# Patient Record
Sex: Male | Born: 1995
Health system: Western US, Academic
[De-identification: ages and names within clinical notes are randomized; demographics above are authoritative.]

## PROBLEM LIST (undated history)

## (undated) DIAGNOSIS — E079 Disorder of thyroid, unspecified: Secondary | ICD-10-CM

## (undated) HISTORY — PX: TONSILLECTOMY AND ADENOIDECTOMY: SUR1326

## (undated) HISTORY — DX: Disorder of thyroid, unspecified: E07.9

---
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
## 2016-05-10 IMAGING — CR CERVICAL SPINE - 2-3 VIEW
1 series · 4 of 4 positions shown · non-contrast
Comparison: None.

CLINICAL DATA: Pain following fall

EXAM:
CERVICAL SPINE - 2-3 VIEW

[Series 1: dxr c- spine ap and lateral · 0.14mm/px · 4 of 4 slices shown]
[im 1/4]
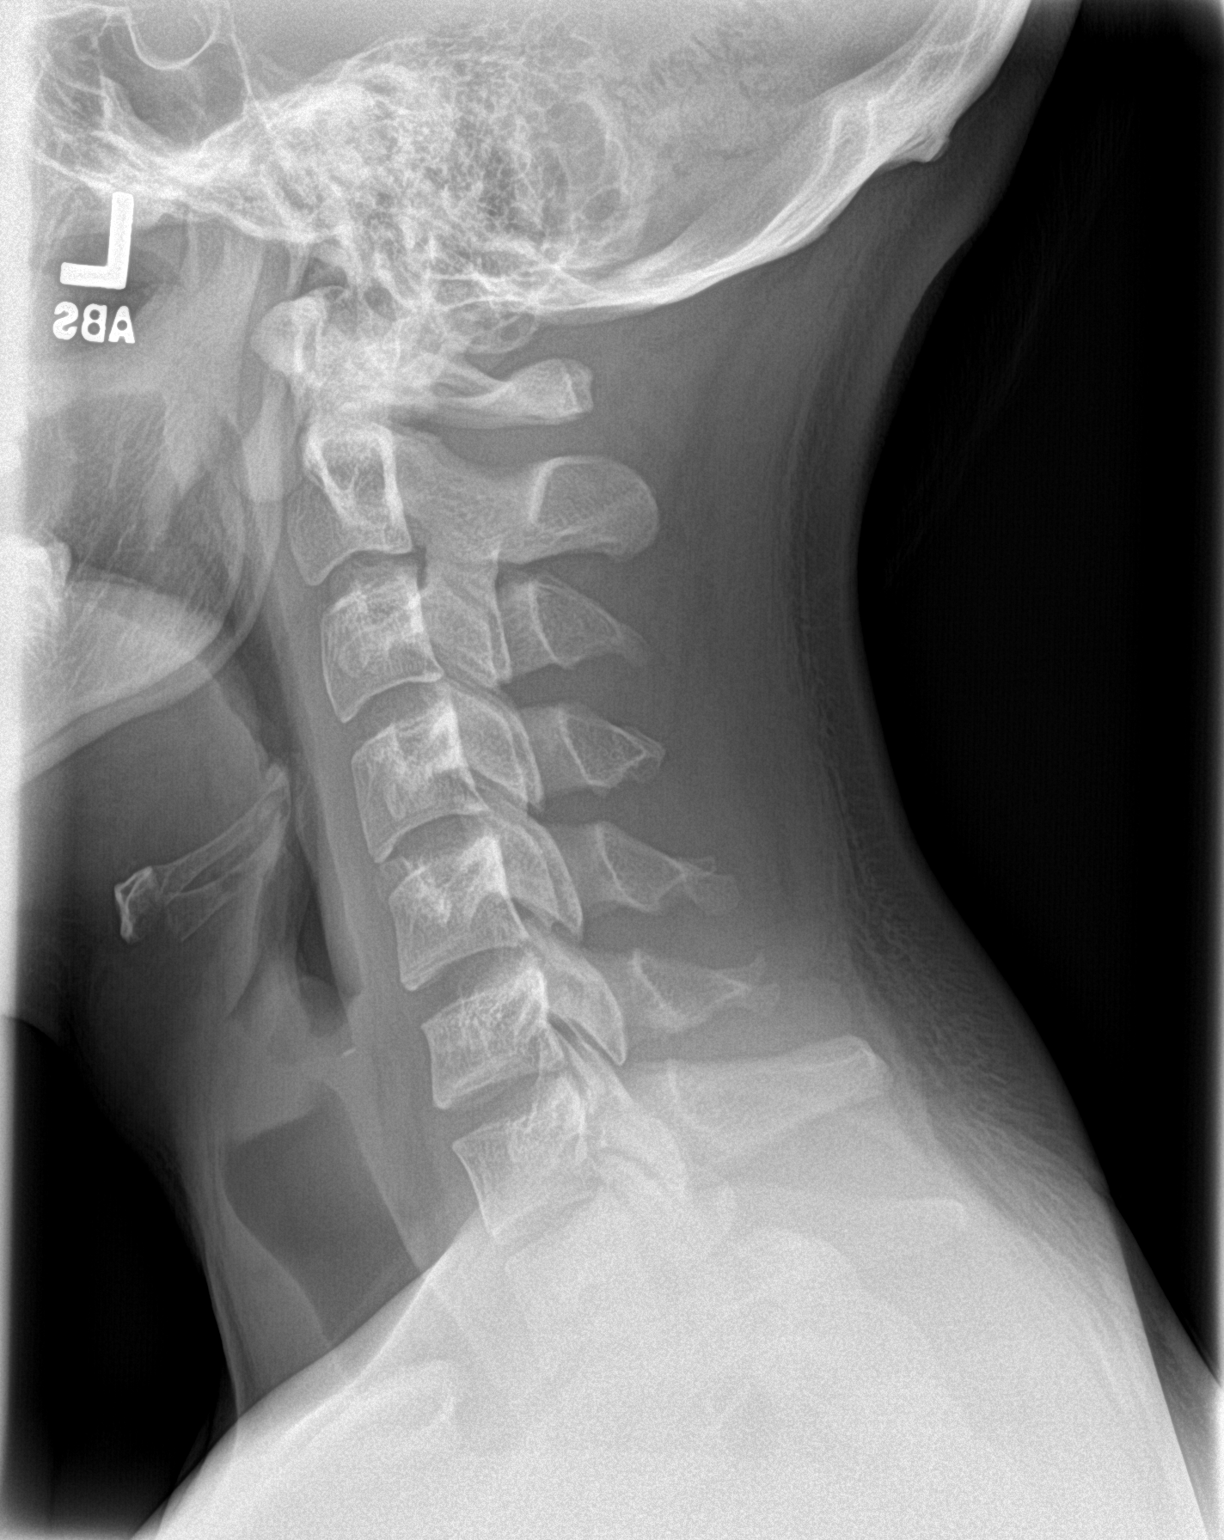
[im 2/4]
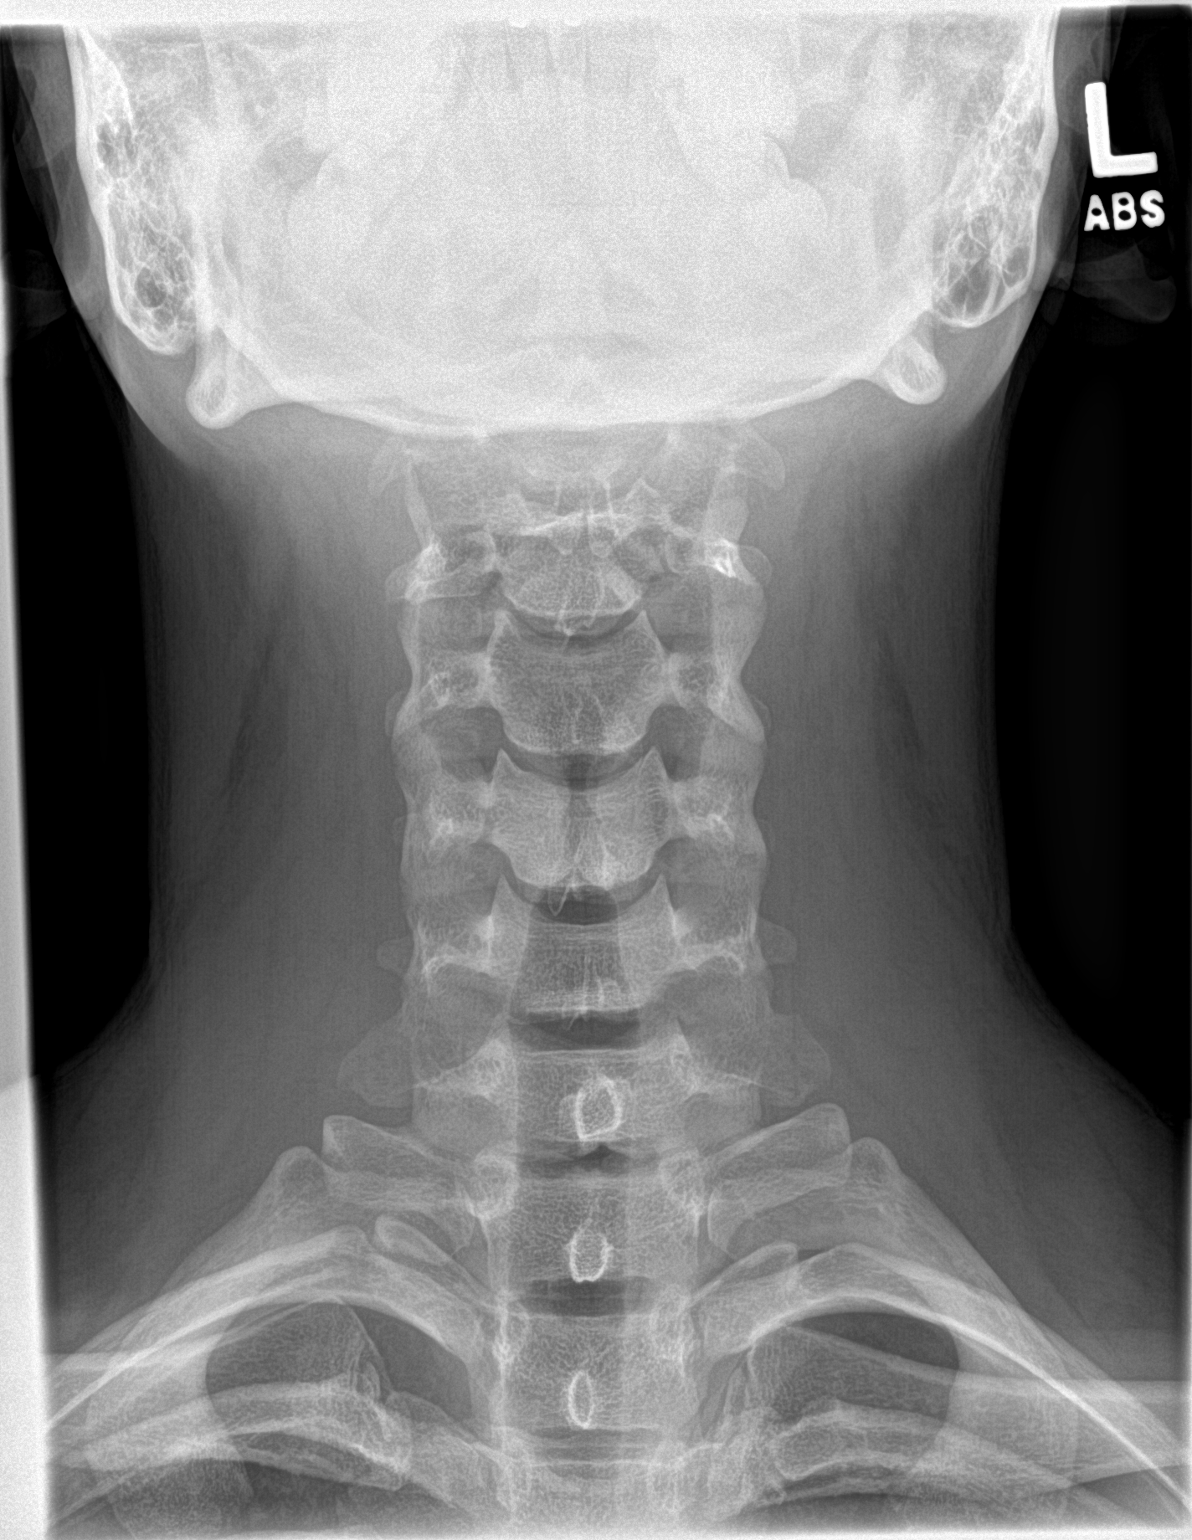
[im 3/4]
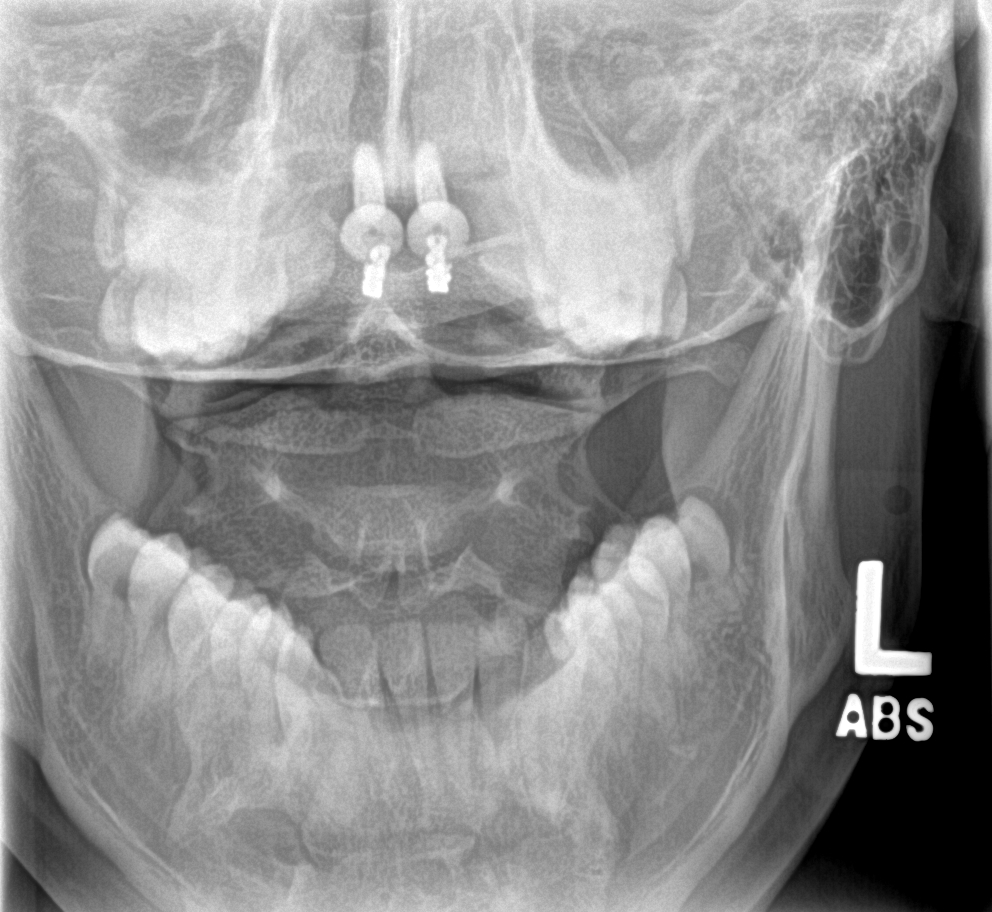
[im 4/4]
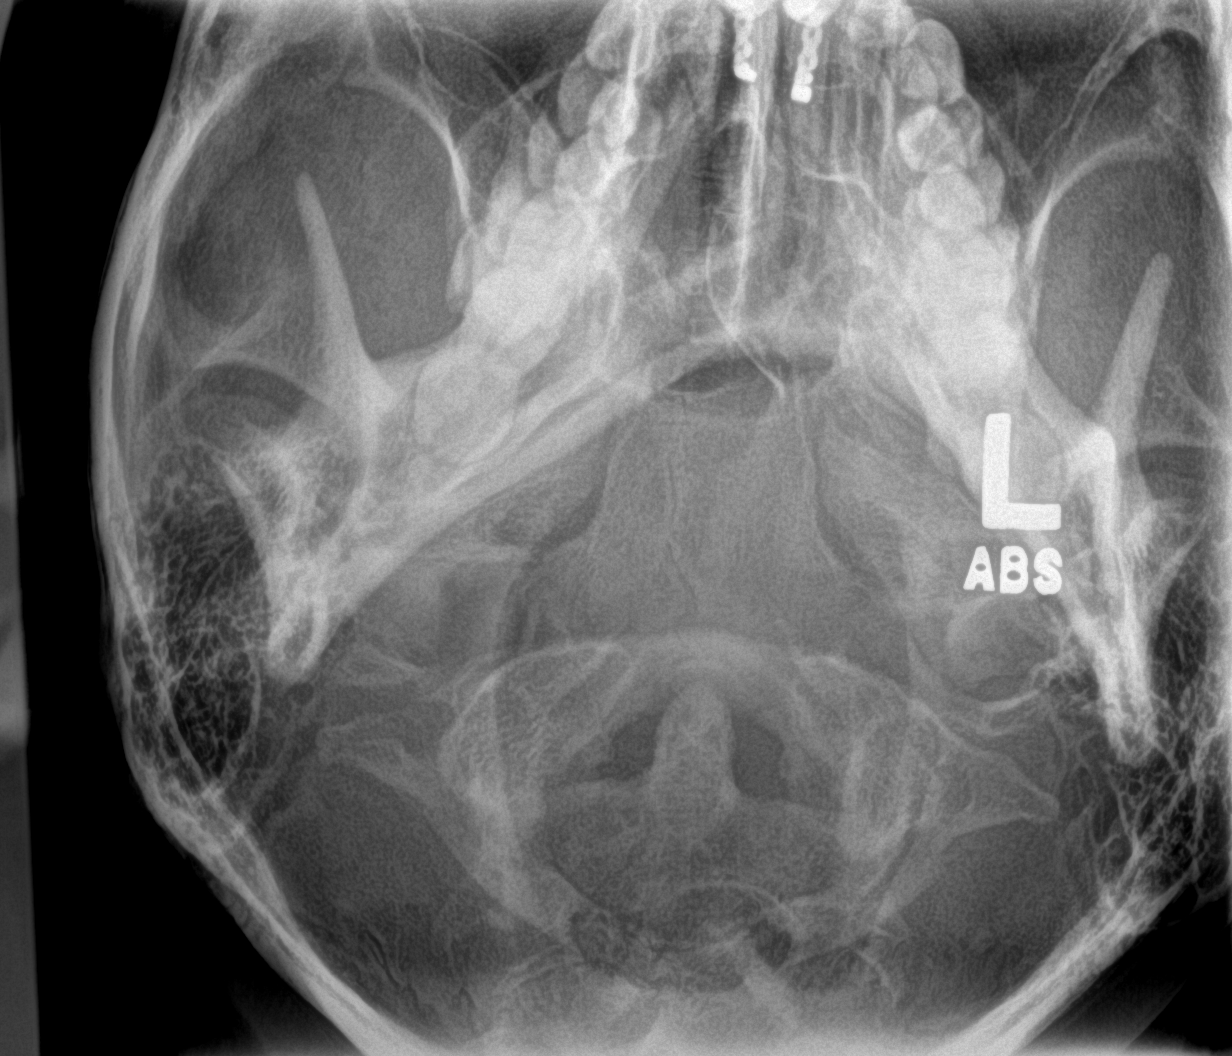

[4 of 4 positions shown; findings below may reference images not displayed]

FINDINGS: Frontal, lateral, and open-mouth odontoid images were obtained.
There is no fracture or spondylolisthesis. Prevertebral soft tissues
and predental space regions are normal. Disc spaces appear intact.
No erosive change.
IMPRESSION: No fracture or spondylolisthesis.  No appreciable arthropathy.

## 2016-08-05 ENCOUNTER — Ambulatory Visit: Payer: No Typology Code available for payment source | Admitting: Family Medicine

## 2016-08-05 ENCOUNTER — Other Ambulatory Visit: Payer: Self-pay

## 2016-09-16 ENCOUNTER — Other Ambulatory Visit: Payer: Self-pay | Admitting: Specialist

## 2016-09-19 ENCOUNTER — Encounter: Payer: Self-pay | Admitting: *Deleted

## 2016-09-19 ENCOUNTER — Encounter
Admission: RE | Disposition: A | Discharge status: Home / Self Care | Payer: Self-pay | Source: Ambulatory Visit | Attending: Specialist

## 2016-09-19 ENCOUNTER — Ambulatory Visit: Payer: No Typology Code available for payment source | Admitting: Anesthesiology

## 2016-09-19 ENCOUNTER — Ambulatory Visit
Admission: RE | Admit: 2016-09-19 | Discharge: 2016-09-19 | Disposition: A | Discharge status: Home / Self Care | Payer: No Typology Code available for payment source | Source: Ambulatory Visit | Attending: Specialist | Admitting: Specialist

## 2016-09-19 DIAGNOSIS — M20012 Mallet finger of left finger(s): Secondary | ICD-10-CM | POA: Insufficient documentation

## 2016-09-19 HISTORY — PX: REPAIR EXTENSOR TENDON: SHX5382

## 2016-09-19 SURGERY — REPAIR, TENDON, EXTENSOR
Anesthesia: General | Laterality: Left

## 2016-09-19 MED ORDER — BUPIVACAINE HCL (PF) 0.5 % IJ SOLN
INTRAMUSCULAR | Status: AC
  Filled 2016-09-19: qty 30

## 2016-09-19 MED ORDER — PROMETHAZINE HCL 25 MG/ML IJ SOLN: 6.2500 mg | INTRAMUSCULAR | Status: DC | PRN

## 2016-09-19 MED ORDER — DEXAMETHASONE SODIUM PHOSPHATE 10 MG/ML IJ SOLN
INTRAMUSCULAR | Status: AC
  Filled 2016-09-19: qty 1

## 2016-09-19 MED ORDER — ONDANSETRON HCL 4 MG/2ML IJ SOLN
INTRAMUSCULAR | Status: AC
Start: 2016-09-19 — End: 2016-09-19
  Filled 2016-09-19: qty 2

## 2016-09-19 MED ORDER — FAMOTIDINE 20 MG PO TABS
ORAL_TABLET | ORAL | Status: AC
  Filled 2016-09-19: qty 1

## 2016-09-19 MED ORDER — SEVOFLURANE IN SOLN
RESPIRATORY_TRACT | Status: AC
  Filled 2016-09-19: qty 250

## 2016-09-19 MED ORDER — MIDAZOLAM HCL 2 MG/2ML IJ SOLN
INTRAMUSCULAR | Status: DC | PRN
  Administered 2016-09-19: 2 mg via INTRAVENOUS

## 2016-09-19 MED ORDER — GABAPENTIN 400 MG PO CAPS
400.0000 mg | ORAL_CAPSULE | Freq: Once | ORAL | Status: AC
  Administered 2016-09-19: 400 mg via ORAL

## 2016-09-19 MED ORDER — GABAPENTIN 400 MG PO CAPS
ORAL_CAPSULE | ORAL | Status: AC
  Filled 2016-09-19: qty 1

## 2016-09-19 MED ORDER — LIDOCAINE HCL (CARDIAC) 20 MG/ML IV SOLN
INTRAVENOUS | Status: DC | PRN
  Administered 2016-09-19: 50 mg via INTRAVENOUS

## 2016-09-19 MED ORDER — CLINDAMYCIN PHOSPHATE 600 MG/50ML IV SOLN
600.0000 mg | Freq: Once | INTRAVENOUS | Status: AC
  Administered 2016-09-19: 600 mg via INTRAVENOUS

## 2016-09-19 MED ORDER — GABAPENTIN 400 MG PO CAPS: 400.0000 mg | ORAL_CAPSULE | Freq: Two times a day (BID) | ORAL | 3 refills | Status: DC

## 2016-09-19 MED ORDER — FAMOTIDINE 20 MG PO TABS
20.0000 mg | ORAL_TABLET | Freq: Once | ORAL | Status: AC
  Administered 2016-09-19: 20 mg via ORAL

## 2016-09-19 MED ORDER — PROPOFOL 10 MG/ML IV BOLUS
INTRAVENOUS | Status: DC | PRN
  Administered 2016-09-19: 200 mg via INTRAVENOUS

## 2016-09-19 MED ORDER — MEPERIDINE HCL 25 MG/ML IJ SOLN: 6.2500 mg | INTRAMUSCULAR | Status: DC | PRN

## 2016-09-19 MED ORDER — FENTANYL CITRATE (PF) 100 MCG/2ML IJ SOLN
INTRAMUSCULAR | Status: AC
  Administered 2016-09-19: 25 ug via INTRAVENOUS
  Filled 2016-09-19: qty 2

## 2016-09-19 MED ORDER — FENTANYL CITRATE (PF) 100 MCG/2ML IJ SOLN
25.0000 ug | INTRAMUSCULAR | Status: DC | PRN
  Administered 2016-09-19 (×2): 25 ug via INTRAVENOUS

## 2016-09-19 MED ORDER — MELOXICAM 7.5 MG PO TABS
15.0000 mg | ORAL_TABLET | Freq: Once | ORAL | Status: AC
  Administered 2016-09-19: 15 mg via ORAL

## 2016-09-19 MED ORDER — LACTATED RINGERS IV SOLN
INTRAVENOUS | Status: DC
  Administered 2016-09-19: 14:00:00 via INTRAVENOUS

## 2016-09-19 MED ORDER — FENTANYL CITRATE (PF) 100 MCG/2ML IJ SOLN
INTRAMUSCULAR | Status: DC | PRN
  Administered 2016-09-19 (×2): 50 ug via INTRAVENOUS

## 2016-09-19 MED ORDER — ONDANSETRON HCL 4 MG/2ML IJ SOLN
INTRAMUSCULAR | Status: DC | PRN
  Administered 2016-09-19: 4 mg via INTRAVENOUS

## 2016-09-19 MED ORDER — ACETAMINOPHEN 10 MG/ML IV SOLN
INTRAVENOUS | Status: AC
Start: 2016-09-19 — End: 2016-09-19
  Filled 2016-09-19: qty 100

## 2016-09-19 MED ORDER — MELOXICAM 7.5 MG PO TABS
ORAL_TABLET | ORAL | Status: AC
  Filled 2016-09-19: qty 2

## 2016-09-19 MED ORDER — ACETAMINOPHEN 10 MG/ML IV SOLN
INTRAVENOUS | Status: DC | PRN
  Administered 2016-09-19: 1000 mg via INTRAVENOUS

## 2016-09-19 MED ORDER — BUPIVACAINE HCL 0.5 % IJ SOLN
INTRAMUSCULAR | Status: DC | PRN
  Administered 2016-09-19: 9 mL

## 2016-09-19 MED ORDER — CHLORHEXIDINE GLUCONATE CLOTH 2 % EX PADS: 6.0000 | MEDICATED_PAD | Freq: Once | CUTANEOUS | Status: DC

## 2016-09-19 MED ORDER — OXYCODONE HCL 5 MG/5ML PO SOLN: 5.0000 mg | Freq: Once | ORAL | Status: DC | PRN

## 2016-09-19 MED ORDER — CEFAZOLIN SODIUM-DEXTROSE 2-4 GM/100ML-% IV SOLN
2.0000 g | INTRAVENOUS | Status: AC
  Administered 2016-09-19: 2 g via INTRAVENOUS

## 2016-09-19 MED ORDER — OXYCODONE HCL 5 MG PO TABS: 5.0000 mg | ORAL_TABLET | Freq: Once | ORAL | Status: DC | PRN

## 2016-09-19 MED ORDER — HYDROCODONE-ACETAMINOPHEN 7.5-325 MG PO TABS: 1.0000 | ORAL_TABLET | Freq: Four times a day (QID) | ORAL | 0 refills | Status: DC | PRN

## 2016-09-19 MED ORDER — DEXAMETHASONE SODIUM PHOSPHATE 10 MG/ML IJ SOLN
INTRAMUSCULAR | Status: DC | PRN
  Administered 2016-09-19: 10 mg via INTRAVENOUS

## 2016-09-19 MED ORDER — CLINDAMYCIN PHOSPHATE 600 MG/50ML IV SOLN
INTRAVENOUS | Status: AC
  Filled 2016-09-19: qty 50

## 2016-09-19 MED ORDER — CEFAZOLIN SODIUM-DEXTROSE 2-4 GM/100ML-% IV SOLN
INTRAVENOUS | Status: AC
  Filled 2016-09-19: qty 100

## 2016-09-19 MED ORDER — MIDAZOLAM HCL 2 MG/2ML IJ SOLN
INTRAMUSCULAR | Status: AC
Start: 2016-09-19 — End: 2016-09-19
  Filled 2016-09-19: qty 2

## 2016-09-19 MED ORDER — MELOXICAM 15 MG PO TABS: 15.0000 mg | ORAL_TABLET | Freq: Every day | ORAL | 3 refills | Status: DC

## 2016-09-19 MED ORDER — FENTANYL CITRATE (PF) 100 MCG/2ML IJ SOLN
INTRAMUSCULAR | Status: AC
  Filled 2016-09-19: qty 2

## 2016-09-19 MED ORDER — PROPOFOL 10 MG/ML IV BOLUS
INTRAVENOUS | Status: AC
  Filled 2016-09-19: qty 20

## 2016-09-19 SURGICAL SUPPLY — 38 items
BAND RUBBER 3X1/6 TAN STRL (MISCELLANEOUS) ×3 IMPLANT
BLADE SURG MINI STRL (BLADE) ×3 IMPLANT
BNDG ESMARK 4X12 TAN STRL LF (GAUZE/BANDAGES/DRESSINGS) IMPLANT
CANISTER SUCT 1200ML W/VALVE (MISCELLANEOUS) ×3 IMPLANT
CHLORAPREP W/TINT 26ML (MISCELLANEOUS) ×3 IMPLANT
CUFF TOURN 18 STER (MISCELLANEOUS) ×3 IMPLANT
ELECT REM PT RETURN 9FT ADLT (ELECTROSURGICAL) ×3
ELECTRODE REM PT RTRN 9FT ADLT (ELECTROSURGICAL) ×1 IMPLANT
GAUZE FLUFF 18X24 1PLY STRL (GAUZE/BANDAGES/DRESSINGS) IMPLANT
GAUZE PETRO XEROFOAM 1X8 (MISCELLANEOUS) ×3 IMPLANT
GAUZE SPONGE 4X4 12PLY STRL (GAUZE/BANDAGES/DRESSINGS) IMPLANT
GLOVE SURG ORTHO 8.0 STRL STRW (GLOVE) ×3 IMPLANT
GOWN STRL REUS W/ TWL LRG LVL3 (GOWN DISPOSABLE) ×1 IMPLANT
GOWN STRL REUS W/TWL LRG LVL3 (GOWN DISPOSABLE) ×2
GOWN STRL REUS W/TWL LRG LVL4 (GOWN DISPOSABLE) ×3 IMPLANT
KIT RM TURNOVER STRD PROC AR (KITS) ×3 IMPLANT
NDL KEITH SZ2.5 (NEEDLE) IMPLANT
NS IRRIG 500ML POUR BTL (IV SOLUTION) ×3 IMPLANT
PACK EXTREMITY ARMC (MISCELLANEOUS) ×3 IMPLANT
PAD CAST CTTN 4X4 STRL (SOFTGOODS) IMPLANT
PADDING CAST 3IN STRL (MISCELLANEOUS) ×2
PADDING CAST BLEND 3X4 STRL (MISCELLANEOUS) ×1 IMPLANT
PADDING CAST COTTON 4X4 STRL (SOFTGOODS)
SPLINT CAST 1 STEP 3X12 (MISCELLANEOUS) IMPLANT
STOCKINETTE BIAS CUT 3 980034 (MISCELLANEOUS) IMPLANT
STOCKINETTE BIAS CUT 4 980044 (GAUZE/BANDAGES/DRESSINGS) ×3 IMPLANT
STOCKINETTE STRL 4IN 9604848 (GAUZE/BANDAGES/DRESSINGS) IMPLANT
SUT ETHIBOND 3-0  EXTR (SUTURE) ×2
SUT ETHIBOND 3-0 EXTR (SUTURE) ×1 IMPLANT
SUT ETHILON 4 0 P 3 18 (SUTURE) ×3 IMPLANT
SUT ETHILON 4-0 (SUTURE) ×2
SUT ETHILON 4-0 FS2 18XMFL BLK (SUTURE) ×1
SUT ETHILON 5-0 (SUTURE)
SUT ETHILON 5-0 C-3 18XMFL BLK (SUTURE)
SUT POLY BUTTON 15MM (SUTURE) IMPLANT
SUT VIC AB 4-0 FS2 27 (SUTURE) IMPLANT
SUTURE ETHLN 4-0 FS2 18XMF BLK (SUTURE) ×1 IMPLANT
SUTURE ETHLN 5-0 C3 18XMF BLK (SUTURE) IMPLANT

## 2016-09-19 NOTE — Discharge Instructions (Signed)
AMBULATORY SURGERY  °DISCHARGE INSTRUCTIONS ° ° °1) The drugs that you were given will stay in your system until tomorrow so for the next 24 hours you should not: ° °A) Drive an automobile °B) Make any legal decisions °C) Drink any alcoholic beverage ° ° °2) You may resume regular meals tomorrow.  Today it is better to start with liquids and gradually work up to solid foods. ° °You may eat anything you prefer, but it is better to start with liquids, then soup and crackers, and gradually work up to solid foods. ° ° °3) Please notify your doctor immediately if you have any unusual bleeding, trouble breathing, redness and pain at the surgery site, drainage, fever, or pain not relieved by medication. ° ° ° °4) Additional Instructions: ° ° ° ° ° ° ° °Please contact your physician with any problems or Same Day Surgery at [phone], Monday through Friday 6 am to 4 pm, or [facility] [place] Main number at [phone]. °

## 2016-09-19 NOTE — Anesthesia Post-op Follow-up Note (Cosign Needed)
Anesthesia QCDR form completed.        

## 2016-09-19 NOTE — Anesthesia Preprocedure Evaluation (Signed)
Anesthesia Evaluation  Patient identified by MRN, date of birth, ID band Patient awake    Reviewed: Allergy & Precautions, NPO status , Patient's Chart, lab work & pertinent test results  History of Anesthesia Complications Negative for: history of anesthetic complications  Airway Mallampati: I  TM Distance: >3 FB Neck ROM: Full    Dental  (+) Implants   Pulmonary neg pulmonary ROS, neg sleep apnea, neg COPD,    breath sounds clear to auscultation- rhonchi (-) wheezing      Cardiovascular Exercise Tolerance: Good (-) hypertension(-) CAD and (-) Past MI  Rhythm:Regular Rate:Normal - Systolic murmurs and - Diastolic murmurs    Neuro/Psych negative neurological ROS  negative psych ROS   GI/Hepatic negative GI ROS, Neg liver ROS,   Endo/Other  negative endocrine ROSneg diabetes  Renal/GU negative Renal ROS     Musculoskeletal negative musculoskeletal ROS (+)   Abdominal (+) - obese,   Peds  Hematology negative hematology ROS (+)   Anesthesia Other Findings   Reproductive/Obstetrics                             Anesthesia Physical Anesthesia Plan  ASA: I  Anesthesia Plan: General   Post-op Pain Management:    Induction: Intravenous  Airway Management Planned: LMA  Additional Equipment:   Intra-op Plan:   Post-operative Plan:   Informed Consent: I have reviewed the patients History and Physical, chart, labs and discussed the procedure including the risks, benefits and alternatives for the proposed anesthesia with the patient or authorized representative who has indicated his/her understanding and acceptance.   Dental advisory given  Plan Discussed with: CRNA and Anesthesiologist  Anesthesia Plan Comments:         Anesthesia Quick Evaluation

## 2016-09-19 NOTE — Anesthesia Postprocedure Evaluation (Signed)
Anesthesia Post Note  Patient: Jose CurryChristian B Pena  Procedure(s) Performed: Procedure(s) (LRB): REPAIR EXTENSOR TENDON (Left)  Patient location during evaluation: PACU Anesthesia Type: General Level of consciousness: awake and alert Pain management: pain level controlled Vital Signs Assessment: post-procedure vital signs reviewed and stable Respiratory status: spontaneous breathing and respiratory function stable Cardiovascular status: stable Anesthetic complications: no     Last Vitals:  Vitals:   09/19/16 1735 09/19/16 1736  BP:  (!) 92/46  Pulse:  (!) 57  Resp:  18  Temp: (P) 36.3 C     Last Pain:  Vitals:   09/19/16 1735  TempSrc:   PainSc: (P) Asleep                 Kenneshia Rehm K

## 2016-09-19 NOTE — Transfer of Care (Signed)
Immediate Anesthesia Transfer of Care Note  Patient: Jose CurryChristian B Cogburn  Procedure(s) Performed: Procedure(s): REPAIR EXTENSOR TENDON (Left)  Patient Location: PACU  Anesthesia Type:General  Level of Consciousness: patient cooperative and lethargic  Airway & Oxygen Therapy: Patient Spontanous Breathing and Patient connected to face mask oxygen  Post-op Assessment: Report given to RN and Post -op Vital signs reviewed and stable  Post vital signs: Reviewed and stable  Last Vitals:  Vitals:   09/19/16 1335 09/19/16 1736  BP: 137/82 (!) 92/46  Pulse: 63 (!) 57  Resp: 16 18  Temp: 36.7 C     Last Pain:  Vitals:   09/19/16 1335  TempSrc: Oral         Complications: No apparent anesthesia complications

## 2016-09-19 NOTE — Op Note (Signed)
09/19/2016  5:28 PM  PATIENT:  Jose Pena    PRE-OPERATIVE DIAGNOSIS:   Mallet finger of left finger  POST-OPERATIVE DIAGNOSIS:  Same  PROCEDURE:  REPAIR EXTENSOR TENDON LEFT SMALL FINGER AND PINNING OF DIP JOINT  SURGEON:  Valinda HoarMILLER,Masayoshi Couzens E, MD  ANESTHESIA:   General  PREOPERATIVE INDICATIONS:  Jose Pena is a  21 y.o. male with a diagnosis of  Mallet finger of left finger who failed conservative measures and elected for surgical management.    The risks benefits and alternatives were discussed with the patient preoperatively including but not limited to the risks of infection, bleeding, nerve injury, cardiopulmonary complications, the need for revision surgery, among others, and the patient was willing to proceed.  EBL: NONE  TOURNIQUET TIME:  25  MIN  OPERATIVE IMPLANTS: 1  0.62 K WIRE  OPERATIVE FINDINGS: RUPTURED LEFT SMALL FINGER TENDON INSERTION  OPERATIVE PROCEDURE:  Patient was brought to the operating room and underwent satisfactory general anesthesia in the supine position. The arm was prepped and draped in a sterile fashion and an Esmarch applied. Tourniquet was inflated to 250 mmHg.A transverse incision was made over the DIP joint and extended proximally and distally on the ulnar and radial sides.  Blunt dissection was carried out exposing the site of tendon laceration.Dissection showed healing in a stretched out fashion. The tendon was divided transversely and the tendon ends were then reapproximated using 4-0 Mersilene suture with the tip of the finger fully extended. Multiple sutures were used. This provided excellent restoration of extension.A 0.62 K wire was drilled through the distal phalanx across the DIP joint into the middle phalanx. Strong fixation was obtained.  Wound was irrigated and closed with 6-0 nylon sutures. 1/2% Sensorcaine was infiltrated into the tissues as a digital block. Sponge and needle counts were correct.  A well-padded  dressing was applied along with a metal splint.  Tourniquet was deflated with good return of blood flow to the hand. The patient was awakened and taken recovery in good condition.

## 2016-09-19 NOTE — H&P (Signed)
THE PATIENT WAS SEEN PRIOR TO SURGERY TODAY.  HISTORY, ALLERGIES, HOME MEDICATIONS AND OPERATIVE PROCEDURE WERE REVIEWED. RISKS AND BENEFITS OF SURGERY DISCUSSED WITH PATIENT AGAIN.  NO CHANGES FROM INITIAL HISTORY AND PHYSICAL NOTED.    

## 2016-09-19 NOTE — Anesthesia Procedure Notes (Signed)
Procedure Name: LMA Insertion Date/Time: 09/19/2016 4:33 PM Performed by: Omer JackWEATHERLY, Jose Vetere Pre-anesthesia Checklist: Patient identified, Patient being monitored, Timeout performed, Emergency Drugs available and Suction available Patient Re-evaluated:Patient Re-evaluated prior to inductionOxygen Delivery Method: Circle system utilized Preoxygenation: Pre-oxygenation with 100% oxygen Intubation Type: IV induction Ventilation: Mask ventilation without difficulty LMA: LMA inserted LMA Size: 4.0 Tube type: Oral Number of attempts: 1 Placement Confirmation: positive ETCO2 and breath sounds checked- equal and bilateral Tube secured with: Tape Dental Injury: Teeth and Oropharynx as per pre-operative assessment

## 2016-09-20 ENCOUNTER — Encounter: Payer: Self-pay | Admitting: Specialist

## 2016-12-09 ENCOUNTER — Ambulatory Visit: Payer: No Typology Code available for payment source | Admitting: Unknown Physician Specialty

## 2016-12-21 ENCOUNTER — Ambulatory Visit (INDEPENDENT_AMBULATORY_CARE_PROVIDER_SITE_OTHER): Payer: Self-pay | Admitting: Family Medicine

## 2016-12-21 ENCOUNTER — Encounter: Payer: Self-pay | Admitting: Family Medicine

## 2016-12-21 DIAGNOSIS — Z72820 Sleep deprivation: Secondary | ICD-10-CM

## 2016-12-21 DIAGNOSIS — E441 Mild protein-calorie malnutrition: Secondary | ICD-10-CM

## 2016-12-21 DIAGNOSIS — E0590 Thyrotoxicosis, unspecified without thyrotoxic crisis or storm: Secondary | ICD-10-CM

## 2016-12-21 HISTORY — DX: Sleep deprivation: Z72.820

## 2016-12-21 HISTORY — DX: Mild protein-calorie malnutrition: E44.1

## 2016-12-21 NOTE — Assessment & Plan Note (Signed)
Discussed poor nutrition approaching malnutrition and sleep deprivation with patient discussed better eating dietary habits better sleep turning off the TV and video games. Adjusting lifestyle accordingly. Patient very reluctant to consider. Of course may be exacerbated by thyroid disease will await labs.

## 2016-12-21 NOTE — Assessment & Plan Note (Signed)
Reviewed notes about hyperthyroid patient was on Tapazole. 10 mg stopped it over 2 years ago and hasn't noticed any change in symptoms. Patient otherwise doing well

## 2016-12-21 NOTE — Progress Notes (Signed)
BP 120/72   Pulse (!) 57   Ht 5\' 11"  (1.803 m)   Wt 185 lb (83.9 kg)   SpO2 99%   BMI 25.80 kg/m    Subjective:    Patient ID: Jose Pena, male    DOB: 15-Dec-1995, 21 y.o.   MRN: [id number]  HPI: Jose Pena is a 21 y.o. male  Chief Complaint  Patient presents with  . Follow-up  . 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 We will lose concentration easily. On chart review patient was on Tapazole 10 mg for hyperthyroid hasn't been taking for over 2 years. Patient's concerned about ADHD. On review patient's lifestyle wakes up at 6:00 doesn't eat breakfast eats junk food biscuits etc. for breakfast and doesn't eat until 1 or 2:00 and again junk mostly [place]. Gets off at 5 tries to work out at 8:30 eats supper plays video games and says he goes to bed about 11:00 and sleeps well.  Relevant past medical, surgical, family and social history reviewed and updated as indicated. Interim medical history since our last visit reviewed. Allergies and medications reviewed and updated.  Review of Systems  Constitutional: Negative.   Respiratory: Negative.   Cardiovascular: Negative.     Per HPI unless specifically indicated above     Objective:    BP 120/72   Pulse (!) 57   Ht 5\' 11"  (1.803 m)   Wt 185 lb (83.9 kg)   SpO2 99%   BMI 25.80 kg/m   Wt Readings from Last 3 Encounters:  12/21/16 185 lb (83.9 kg) (84 %, Z= 1.00)*  09/19/16 178 lb (80.7 kg) (79 %, Z= 0.82)*  11/30/15 196 lb (88.9 kg) (92 %, Z= 1.40)*   * Growth percentiles are based on CDC 2-20 Years data.    Physical Exam  Constitutional: He is oriented to person, place, and time. He appears well-developed and well-nourished.  HENT:  Head: Normocephalic and atraumatic.  Eyes: Conjunctivae and EOM are normal.  Neck: Normal range of motion. No tracheal deviation present. No  thyromegaly present.  Cardiovascular: Normal rate, regular rhythm and normal heart sounds.   Pulmonary/Chest: Effort normal and breath sounds normal.  Musculoskeletal: Normal range of motion.  Lymphadenopathy:    He has no cervical adenopathy.  Neurological: He is alert and oriented to person, place, and time.  Skin: No erythema.  Psychiatric: He has a normal mood and affect. His behavior is normal. Judgment and thought content normal.    Results for orders placed or performed during the hospital encounter of 09/13/15  Lipase, blood  Result Value Ref Range   Lipase 21 11 - 51 U/L  Comprehensive metabolic panel  Result Value Ref Range   Sodium 140 135 - 145 mmol/L   Potassium 4.3 3.5 - 5.1 mmol/L   Chloride 103 101 - 111 mmol/L   CO2 25 22 - 32 mmol/L   Glucose, Bld 124 (H) 65 - 99 mg/dL   BUN 24 (H) 6 - 20 mg/dL   Creatinine, Ser 4.091.19 0.61 - 1.24 mg/dL   Calcium 9.7 8.9 - 81.110.3 mg/dL   Total Protein 8.1 6.5 - 8.1 g/dL   Albumin 4.9 3.5 - 5.0 g/dL   AST 39 15 - 41 U/L   ALT 42 17 - 63 U/L   Alkaline Phosphatase 65 38 - 126 U/L  Total Bilirubin 2.2 (H) 0.3 - 1.2 mg/dL   GFR calc non Af Amer >60 >60 mL/min   GFR calc Af Amer >60 >60 mL/min   Anion gap 12 5 - 15  CBC  Result Value Ref Range   WBC 17.8 (H) 3.8 - 10.6 K/uL   RBC 5.99 (H) 4.40 - 5.90 MIL/uL   Hemoglobin 18.0 13.0 - 18.0 g/dL   HCT 16.1 (H) 09.6 - 04.5 %   MCV 87.0 80.0 - 100.0 fL   MCH 30.0 26.0 - 34.0 pg   MCHC 34.4 32.0 - 36.0 g/dL   RDW 40.9 81.1 - 91.4 %   Platelets 204 150 - 440 K/uL  Urinalysis complete, with microscopic ([facility] only)  Result Value Ref Range   Color, Urine AMBER (A) YELLOW   APPearance HAZY (A) CLEAR   Glucose, UA NEGATIVE NEGATIVE mg/dL   Bilirubin Urine NEGATIVE NEGATIVE   Ketones, ur 1+ (A) NEGATIVE mg/dL   Specific Gravity, Urine 1.027 1.005 - 1.030   Hgb urine dipstick NEGATIVE NEGATIVE   pH 8.0 5.0 - 8.0   Protein, ur 100 (A) NEGATIVE mg/dL   Nitrite NEGATIVE NEGATIVE    Leukocytes, UA NEGATIVE NEGATIVE   RBC / HPF 0-5 0 - 5 RBC/hpf   WBC, UA 0-5 0 - 5 WBC/hpf   Bacteria, UA NONE SEEN NONE SEEN   Squamous Epithelial / LPF NONE SEEN NONE SEEN   Mucous PRESENT       Assessment & Plan:   Problem List Items Addressed This Visit      Endocrine   Hyperthyroidism    Reviewed notes about hyperthyroid patient was on Tapazole. 10 mg stopped it over 2 years ago and hasn't noticed any change in symptoms. Patient otherwise doing well      Relevant Orders   Comprehensive metabolic panel   CBC with Differential/Platelet   TSH     Other   Malnutrition of mild degree (HCC)   Sleep deprivation    Discussed poor nutrition approaching malnutrition and sleep deprivation with patient discussed better eating dietary habits better sleep turning off the TV and video games. Adjusting lifestyle accordingly. Patient very reluctant to consider. Of course may be exacerbated by thyroid disease will await labs.          Follow up plan: Return in about 4 weeks (around 01/18/2017) for Recheck changes to lifestyle sleep and will review thyroid status.

## 2016-12-22 ENCOUNTER — Telehealth: Payer: Self-pay | Admitting: Family Medicine

## 2016-12-22 DIAGNOSIS — E0590 Thyrotoxicosis, unspecified without thyrotoxic crisis or storm: Secondary | ICD-10-CM

## 2016-12-22 LAB — CBC WITH DIFFERENTIAL/PLATELET
BASOS: 0 %
Basophils Absolute: 0 10*3/uL (ref 0.0–0.2)
EOS (ABSOLUTE): 0 10*3/uL (ref 0.0–0.4)
EOS: 0 %
HEMATOCRIT: 47.5 % (ref 37.5–51.0)
Hemoglobin: 16.3 g/dL (ref 13.0–17.7)
IMMATURE GRANS (ABS): 0 10*3/uL (ref 0.0–0.1)
Immature Granulocytes: 0 %
LYMPHS: 23 %
Lymphocytes Absolute: 2 10*3/uL (ref 0.7–3.1)
MCH: 30.5 pg (ref 26.6–33.0)
MCHC: 34.3 g/dL (ref 31.5–35.7)
MCV: 89 fL (ref 79–97)
MONOS ABS: 0.4 10*3/uL (ref 0.1–0.9)
Monocytes: 5 %
NEUTROS ABS: 6 10*3/uL (ref 1.4–7.0)
Neutrophils: 72 %
PLATELETS: 189 10*3/uL (ref 150–379)
RBC: 5.35 x10E6/uL (ref 4.14–5.80)
RDW: 13.7 % (ref 12.3–15.4)
WBC: 8.4 10*3/uL (ref 3.4–10.8)

## 2016-12-22 LAB — COMPREHENSIVE METABOLIC PANEL
A/G RATIO: 2.4 — AB (ref 1.2–2.2)
ALBUMIN: 5.2 g/dL (ref 3.5–5.5)
ALK PHOS: 56 IU/L (ref 39–117)
ALT: 25 IU/L (ref 0–44)
AST: 24 IU/L (ref 0–40)
BILIRUBIN TOTAL: 1.1 mg/dL (ref 0.0–1.2)
BUN / CREAT RATIO: 18 (ref 9–20)
BUN: 17 mg/dL (ref 6–20)
CO2: 29 mmol/L (ref 18–29)
Calcium: 10 mg/dL (ref 8.7–10.2)
Chloride: 102 mmol/L (ref 96–106)
Creatinine, Ser: 0.93 mg/dL (ref 0.76–1.27)
GFR calc Af Amer: 137 mL/min/{1.73_m2} (ref >59)
GFR calc non Af Amer: 119 mL/min/{1.73_m2} (ref >59)
GLUCOSE: 83 mg/dL (ref 65–99)
Globulin, Total: 2.2 g/dL (ref 1.5–4.5)
POTASSIUM: 5 mmol/L (ref 3.5–5.2)
SODIUM: 142 mmol/L (ref 134–144)
Total Protein: 7.4 g/dL (ref 6.0–8.5)

## 2016-12-22 LAB — TSH: TSH: 0.289 u[IU]/mL — ABNORMAL LOW (ref 0.450–4.500)

## 2016-12-22 NOTE — Telephone Encounter (Signed)
Phone call Discussed with patient continued hyperthyroidism. Reviewed how dangerous untreated hyperthyroid is now again damaged the body. Indicated to patient to work with the doctor appropriately for medication and to not just stop it. We'll make referral for hyperthyroid

## 2016-12-27 ENCOUNTER — Telehealth: Payer: Self-pay | Admitting: Family Medicine

## 2016-12-27 NOTE — Telephone Encounter (Signed)
Called Jose HongJudy back and gave her contact info for the Mobile Crisis Management on [address] [facility] [address] [place]. Phone. [phone]

## 2016-12-27 NOTE — Telephone Encounter (Signed)
Or can call [facility] @ [phone]

## 2016-12-27 NOTE — Telephone Encounter (Signed)
Pt can call [facility] @ call [phone].

## 2016-12-27 NOTE — Telephone Encounter (Signed)
Patient needs to be referred to someone per his Grandmother that could help him deal with some issues he is having with loss of his mom at an earlier age.  He feels that he needs to talk to someone.   [phone]--[phone]  Thanks

## 2016-12-27 NOTE — Telephone Encounter (Signed)
Called and spoke to SproulJudy. Numbers were relayed to her. Pt stated she was currently on the phone with him and he was crying hysterically and she stated, "I dont know what he's going to do if he does not get help" Inocencio Homesdvised Judy to have [facility], or for her to call emergency services if she feared he would hurt himself or others.

## 2017-10-13 ENCOUNTER — Telehealth: Payer: Self-pay | Admitting: Family Medicine

## 2017-10-13 NOTE — Telephone Encounter (Signed)
Called pt and spoke with he asked me to speak with his grandmother. I told her what Fleet ContrasRachel said and she stated that he had tried the cough syrups and mucinex and they aren't working.

## 2017-10-13 NOTE — Telephone Encounter (Signed)
Called to speak with pt and he asked me to speak with his grandmother. While reading her what was stated by Fleet Contrasachel she began to yell about getting another doctor because this is the second time she has tried to get him in and been unable to do so. She yelled and stated that he had been taking the medicine for about 3 weeks and it wasn't helping. She stated she was going to take him to the urgent care and possibly find him a new doctor.

## 2017-10-13 NOTE — Telephone Encounter (Signed)
If they're very concerned, they can go to an UC but if he feels he can wait until Monday I am happy to see him. It's a good idea to keep up the mucinex and delsym even if not seeing immediate results as this will help things from progressing

## 2017-10-13 NOTE — Telephone Encounter (Signed)
Can try antihistamine and delsym or robitussin - if significantly worse over weekend [facility] but if not I can see him Pena if needed. Rest, fluids  Copied from CRM [phone]. Topic: General - Other >> Oct 13, 2017 11:09 AM Elliot GaultBell, Tiffany M wrote: Jose Pena name: Jose Pena [facility](Grandfather)  Call back number: [phone] :  Reason for call:    Jose Pena [facility](Grandfather) would like patient seen today due to patient experiencing cold like symptoms. Informed grandfather practice is 100% percent booked, grandfather would like to speak with a nurse directly from the practice, please advise >> Oct 13, 2017 11:14 AM Elliot GaultBell, Tiffany M wrote: Jose Pena name: Jose Pena [facility](Grandfather)  Call back number: [phone] :  Reason for call:    Jose Pena [facility](Grandfather) would like patient seen today due to patient experiencing cold like symptoms. Informed grandfather practice is 100% percent booked, grandfather would like to speak with a nurse directly from the practice, pleas advise >> Oct 13, 2017 11:32 AM Crist InfanteHarrald, Kathy J wrote: Jose LoronGrandfather called again to ask if pt can be seen. >> Oct 13, 2017 12:19 PM Adela PortsWilliamson, Jose M wrote: Jose MondaySpoke with the pt and he said its a cough. he hasn't had a fever, no chills, no body aches...urgent care or is he ok to take something otc?

## 2017-10-13 NOTE — Telephone Encounter (Signed)
Attempted to contact patient.  Spoke with patient's grandfather and advised that we can given his grandson and appointment for Monday.

## 2018-03-06 ENCOUNTER — Ambulatory Visit: Payer: Medicaid Other | Admitting: Family Medicine

## 2018-03-06 ENCOUNTER — Encounter: Payer: Self-pay | Admitting: Family Medicine

## 2018-03-06 VITALS — BP 120/69 | HR 56 | Temp 97.5°F | Ht 71.0 in | Wt 199.8 lb

## 2018-03-06 DIAGNOSIS — Z202 Contact with and (suspected) exposure to infections with a predominantly sexual mode of transmission: Secondary | ICD-10-CM

## 2018-03-06 DIAGNOSIS — Z114 Encounter for screening for human immunodeficiency virus [HIV]: Secondary | ICD-10-CM

## 2018-03-06 NOTE — Progress Notes (Signed)
BP 120/69 (BP Location: Right Arm, Patient Position: Sitting, Cuff Size: Normal)   Pulse (!) 56   Temp (!) 97.5 F (36.4 C) (Oral)   Ht 5\' 11"  (1.803 m)   Wt 199 lb 12.8 oz (90.6 kg)   SpO2 95%   BMI 27.87 kg/m    Subjective:    Patient ID: Jose Pena, male    DOB: 06-Jul-1996, 22 y.o.   MRN: [id number]  HPI: Jose Pena is a 22 y.o. male  Chief Complaint  Patient presents with  . Exposure to STD    Patient states he would like full panel. A partner from a few months ago stated she had chlamydia.  (FYI, Due for Physical)   Here today for a full STI check. Has had a partner from several months ago contact him about testing positive for chlamydia. Denies dysuria, rashes, lesions, penile discharge, fevers, abdominal pain. No reported hx of STIs.   Relevant past medical, surgical, family and social history reviewed and updated as indicated. Interim medical history since our last visit reviewed. Allergies and medications reviewed and updated.  Review of Systems  Per HPI unless specifically indicated above     Objective:    BP 120/69 (BP Location: Right Arm, Patient Position: Sitting, Cuff Size: Normal)   Pulse (!) 56   Temp (!) 97.5 F (36.4 C) (Oral)   Ht 5\' 11"  (1.803 m)   Wt 199 lb 12.8 oz (90.6 kg)   SpO2 95%   BMI 27.87 kg/m   Wt Readings from Last 3 Encounters:  03/06/18 199 lb 12.8 oz (90.6 kg)  12/21/16 185 lb (83.9 kg) (84 %, Z= 1.00)*  09/19/16 178 lb (80.7 kg) (79 %, Z= 0.82)*   * Growth percentiles are based on CDC (Boys, 2-20 Years) data.    Physical Exam  Constitutional: He is oriented to person, place, and time. He appears well-developed and well-nourished.  HENT:  Head: Atraumatic.  Eyes: Conjunctivae and EOM are normal.  Neck: Normal range of motion. Neck supple.  Cardiovascular: Normal rate and regular rhythm.  Pulmonary/Chest: Effort normal and breath sounds normal.  Genitourinary:  Genitourinary Comments: GU exam declined   Musculoskeletal: Normal range of motion.  Neurological: He is alert and oriented to person, place, and time.  Skin: Skin is warm and dry.  Psychiatric: He has a normal mood and affect. His behavior is normal.  Nursing note and vitals reviewed.  Results for orders placed or performed in visit on 03/06/18  GC/Chlamydia Probe Amp  Result Value Ref Range   Chlamydia trachomatis, NAA Negative Negative   Neisseria gonorrhoeae by PCR Negative Negative  HIV antibody  Result Value Ref Range   HIV Screen 4th Generation wRfx Non Reactive Non Reactive  RPR  Result Value Ref Range   RPR Ser Ql Non Reactive Non Reactive  HSV(herpes simplex vrs) 1+2 ab-IgG  Result Value Ref Range   HSV 1 Glycoprotein G Ab, IgG <0.91 0.00 - 0.90 index   HSV 2 IgG, Type Spec <0.91 0.00 - 0.90 index      Assessment & Plan:   Problem List Items Addressed This Visit    None    Visit Diagnoses    Exposure to STD    -  Primary   Await screening lab results. Will tx as needed.    Relevant Orders   HIV antibody (Completed)   RPR (Completed)   HSV(herpes simplex vrs) 1+2 ab-IgG (Completed)   GC/Chlamydia Probe Amp (Completed)  Encounter for screening for HIV       Relevant Orders   HIV antibody (Completed)       Follow up plan: Return for CPE.

## 2018-03-07 LAB — HSV(HERPES SIMPLEX VRS) I + II AB-IGG: HSV 1 Glycoprotein G Ab, IgG: <0.91 index (ref 0.00–0.90)

## 2018-03-07 LAB — RPR: RPR: NONREACTIVE

## 2018-03-07 LAB — HIV ANTIBODY (ROUTINE TESTING W REFLEX): HIV SCREEN 4TH GENERATION: NONREACTIVE

## 2018-03-08 LAB — GC/CHLAMYDIA PROBE AMP
Chlamydia trachomatis, NAA: NEGATIVE
Neisseria gonorrhoeae by PCR: NEGATIVE

## 2018-03-09 ENCOUNTER — Encounter: Payer: Self-pay | Admitting: Family Medicine

## 2018-03-09 NOTE — Telephone Encounter (Signed)
This encounter was created in error - please disregard.

## 2018-03-10 NOTE — Patient Instructions (Signed)
Follow up for CPE 

## 2018-10-17 ENCOUNTER — Ambulatory Visit: Payer: BLUE CROSS/BLUE SHIELD

## 2018-10-17 DIAGNOSIS — Z23 Encounter for immunization: Secondary | ICD-10-CM

## 2018-10-18 ENCOUNTER — Ambulatory Visit: Payer: BLUE CROSS/BLUE SHIELD

## 2018-10-18 DIAGNOSIS — Z0000 Encounter for general adult medical examination without abnormal findings: Secondary | ICD-10-CM

## 2018-10-18 DIAGNOSIS — Z23 Encounter for immunization: Secondary | ICD-10-CM

## 2018-10-18 NOTE — Progress Notes
@CADXDATE @PATIENT : Jonathan Weeks  MRN: [id number]  DOB: [birth year]  DATE OF SERVICE: 10/18/2018  REFERRED BY:    CHIEF COMPLAINT:   Chief Complaint   Patient presents with   ??? Annual Exam       HISTORY OF PRESENT ILLNESS:   Jonathan Weeks  is a 23 y.o.   male who is here for check up. Doing well. Occas blood in stools.     PAST MEDICAL HISTORY:   There is no problem list on file for this patient.      Family History   Problem Relation Age of Onset   ??? Colon cancer Paternal Aunt    ??? Breast cancer Maternal Aunt    ??? Breast cancer Maternal Aunt    ??? Skin cancer Neg Hx      Social History     Socioeconomic History   ??? Marital status: Unknown     Spouse name: Not on file   ??? Number of children: Not on file   ??? Years of education: Not on file   ??? Highest education level: Not on file   Occupational History   ??? Not on file   Social Needs   ??? Financial resource strain: Not on file   ??? Food insecurity:     Worry: Not on file     Inability: Not on file   ??? Transportation needs:     Medical: Not on file     Non-medical: Not on file   Tobacco Use   ??? Smoking status: Never Smoker   ??? Smokeless tobacco: Never Used   Substance and Sexual Activity   ??? Alcohol use: Not on file   ??? Drug use: Not on file   ??? Sexual activity: Yes     Partners: Female   Lifestyle   ??? Physical activity:     Days per week: Not on file     Minutes per session: Not on file   ??? Stress: Not on file   Relationships   ??? Social connections:     Talks on phone: Not on file     Gets together: Not on file     Attends religious service: Not on file     Active member of club or organization: Not on file     Attends meetings of clubs or organizations: Not on file     Relationship status: Not on file   Other Topics Concern   ??? Do you exercise at least a day, 3 or more days a week? Not Asked   ??? Types of Exercise? (List in Comments) Not Asked   ??? Do you follow a special diet? Not Asked   ??? Vegan? Not Asked   ??? Vegetarian? Not Asked ??? Pescatarian? Not Asked   ??? Lactose Free? Not Asked   ??? Gluten Free? Not Asked   ??? Omnivore? Not Asked   Social History Narrative   ??? Not on file       MEDICATIONS:  Outpatient Medications Prior to Visit   Medication Sig Dispense Refill   ??? clindamycin-benzoyl peroxide 1-5% gel Apply topically every morning Apply to affected areas.. 25 g 6   ??? clobetasol 0.05% external solution Apply topically two (2) times daily Scalp.  Avoid face.  Monday-Friday.. 60 mL 6   ??? ketoconazole 2% shampoo Apply to scalp.  Leave on 5-10 minutes, then wash off.  Use 3/week.. 120 mL 6   ??? tretinoin 0.025% cream Apply topically at bedtime  Apply to affected areas.. 45 g 6     No facility-administered medications prior to visit.        ALLERGIES:   No Known Allergies    IMMUNIZATIONS:  Immunization History   Administered Date(s) Administered   ??? Rabies IM fibroblast culture ([facility]/RABAVERT) 10/23/2015, 11/18/2015, 11/23/2015   ??? Tdap 10/23/2015        REVIEW OF SYSTEMS:  14 system review performed; all systems negative except as documented above in HPI    VITAL SIGNS: BP 131/71  ~ Pulse 69  ~ Temp 36.6 ???C (97.8 ???F) (Oral)  ~ Resp 18  ~ Ht 5' 10'' (1.778 m)  ~ Wt 173 lb 12.8 oz (78.8 kg)  ~ SpO2 97%  ~ BMI 24.94 kg/m???     PHYSICAL EXAM:  Constitutional: Patient appears well-developed, well nourished. Appears  stated age.  Eyes: sclera, conjunctiva, pupils exam normal  Ears, Nose, Mouth, and Throat: Tympanic membranes, septum, teeth, mucus membranes, oropharynx normal  Neck: Normal range of motion. Neck supple. No thyromegaly present, no thyroid nodules, no bruits, no adenopathy. 2+ carotids without bruits  Cardiovascular: Normal rate, regular rhythm, normal S1, S2 with   no murmur  Respiratory: Effort and breath sounds normal. No stridor, respiratory distress.    No wheezes, ronchi, or rales.  Breast:  No dimpling, nipples and areola appear normal with no discharge.   No masses or lesions Gastrointestinal: Soft. Bowel sounds are normal. No distension, no masses or  hepatosplenomegaly. There is no rebound or guarding. No tenderness.  Genitourinary: No CVA tenderness.   Musculoskeletal:  Normal range of motion. No swelling, redness,no tenderness. No deformities  Hematologic/Lymph/Immun: no palpable lymph nodes, bruising  Neurological: Alert and oriented to person, place, and time. CN's II-XII grossly intact   Normal muscle tone. 5/5 bilat hand grip, biceps, triceps, quads, foot flexion and extension. Reflexes 2+ biceps, patellar, toes downgoing.  Skin: Skin is warm and dry. No rash noted. No erythema. No ulcers  Psychiatric: Normal mood and affect.   anal area with no fissure, ant skin tag, no blood       LAB REVIEW:  Lab Results   Component Value Date    HGBA1C 5.5 02/11/2015       No results found for: [facility]  Lab Results   Component Value Date    WBC 5.84 02/11/2015    HGB 15.4 02/11/2015    HCT 45.8 02/11/2015    MCV 93.1 02/11/2015    PLT 203 02/11/2015     Lab Results   Component Value Date    CREAT 1.1 02/11/2015    BUN 22 02/11/2015    NA 138 02/11/2015    K 4.8 02/11/2015    CL 100 02/11/2015    CO2 27 02/11/2015     Lab Results   Component Value Date    HGBA1C 5.5 02/11/2015     Lab Results   Component Value Date    ALT 16 02/11/2015    AST 17 02/11/2015    ALKPHOS 46 02/11/2015    BILITOT 1.1 02/11/2015     Lab Results   Component Value Date    TSH 1.3 02/11/2015     Lab Results   Component Value Date    CALCIUM 9.9 02/11/2015     Lab Results   Component Value Date    CHOL 151 02/11/2015    CHOLHDL 51 02/11/2015    CHOLDLCAL 88 02/11/2015    TRIGLY 62 02/11/2015  No results found for: INR, INRPOC, PT    Health Maintenance   Topic Date Due   ??? HPV Vaccines (1 - Male 2-dose series) 01/17/2008   ??? Tdap/Td Vaccine (2 - Td) 10/22/2025   ??? HIV Screening  Completed   ??? Influenza Vaccine  Discontinued     I have reviewed the EKG, labs, and medical records submitted.  ASSESSMENT AND PLAN: Problem List Items Addressed This Visit     None      Visit Diagnoses     Need for HPV vaccine    -  Primary    Relevant Orders    HPV 9-valent vaccine IM (Gardasil 9) (PF) SYR/SDV (60-82 years of age) (shared decision making for 36-6 years of age)        There are no Patient Instructions on file for this visit.  No follow-ups on file.  The patient understands and agrees with the above recommendations.   25 minutes face to face time was spent with the patient, >50% of the time on counseling, advice, and management.    Leotis Pain, MD  Internal Medicine  Associate Clinical Professor of Medicine, [facility]  [address]  [place], [place] [address]

## 2018-10-19 LAB — HCV Ab Screen: HCV ANTIBODY SCREEN: NONREACTIVE

## 2018-10-19 LAB — Lipid Panel: CHOLESTEROL,LDL,CALCULATED: 105 mg/dL — ABNORMAL HIGH (ref <100)

## 2018-10-19 LAB — Comprehensive Metabolic Panel
ALBUMIN: 4.7 g/dL (ref 3.9–5.0)
TOTAL CO2: 26 mmol/L (ref 20–30)

## 2018-10-19 LAB — Differential Automated: ABSOLUTE IMMATURE GRAN COUNT: 0.04 10*3/uL (ref 0.00–0.04)

## 2018-10-19 LAB — HIV-1/2 Ag/Ab 4th Generation with Reflex Confirmation: HIV-1/2 AG/AB 4TH GENERATION WITH REFLEX CONFIRMATION: NONREACTIVE

## 2018-10-19 LAB — CBC: WHITE BLOOD CELL COUNT: 6.78 10*3/uL (ref 4.16–9.95)

## 2018-10-19 LAB — Hgb A1c: HGB A1C - HPLC: 5.6 (ref <5.7)

## 2018-10-19 LAB — Chlamydia trachomatis/Neisseria gonorrhoeae PCR: NEISSERIA GONORRHOEAE PCR: NEGATIVE

## 2018-10-19 LAB — UA,Microscopic: WBCS: 1 {cells}/uL (ref 0–22)

## 2018-10-19 LAB — UA,Dipstick: BLOOD: NEGATIVE (ref 5.0–8.0)

## 2019-05-14 ENCOUNTER — Ambulatory Visit: Payer: BLUE CROSS/BLUE SHIELD

## 2019-05-22 ENCOUNTER — Telehealth: Payer: BLUE CROSS/BLUE SHIELD

## 2019-05-22 ENCOUNTER — Ambulatory Visit: Payer: BLUE CROSS/BLUE SHIELD | Attending: Gastroenterology

## 2019-05-22 DIAGNOSIS — K529 Noninfective gastroenteritis and colitis, unspecified: Secondary | ICD-10-CM

## 2019-05-22 NOTE — Telephone Encounter
Patient is aware lab/pathology (calprotectin stool) services may or may not be covered under insurance. Patient instructed to call insurance to verify coverage. Courtesy letter given and signed by patient.

## 2019-05-22 NOTE — Consults
GASTROENTEROLOGY CONSULTATION      PATIENT: Jonathan Weeks  MRN: [id number]  DOB: Jan 15, 1996  DATE OF SERVICE: 05/22/2019     REFERRING PROVIDER: Joslyn Devon, MD  PRIMARY CARE PROVIDER: Hoover Browns., MD    REASON FOR REFERRAL:   No chief complaint on file.        HISTORY OF PRESENT ILLNESS:  Jonathan Weeks is a 23 y.o. male complaining of diarrhea and cramping for about 4 or 5 weeks.  He has very few normal stools.  Most of his stools or diarrhea with some level of urgency and mild cramping.  He usually has 2-4 bowel movements per day.  Although initially he said there was blood on the paper towel in retrospect there is often blood mixed in with the stool.  He also realizes that while his symptoms have been more intense for the past for 5 weeks it actually been present for several years gradually intensifying.  The patient's appetite has been good with no significant alteration in weight.  He denies any bouts of nausea or vomiting.     PAST MEDICAL HISTORY:  Past Medical History:   Diagnosis Date   ? Occult blood in stools         MEDICATIONS:  Current Outpatient Medications   Medication Sig   ? clindamycin-benzoyl peroxide 1-5% gel Apply topically every morning Apply to affected areas..   ? clobetasol 0.05% external solution Apply topically two (2) times daily Scalp.  Avoid face.  Monday-Friday..   ? ketoconazole 2% shampoo Apply to scalp.  Leave on 5-10 minutes, then wash off.  Use 3/week..   ? tretinoin 0.025% cream Apply topically at bedtime Apply to affected areas..     No current facility-administered medications for this visit.         ALLERGIES:  has No Known Allergies.     PAST SURGICAL HISTORY:  No past surgical history on file.     FAMILY HISTORY:   family history includes Breast cancer in his maternal aunt and maternal aunt; Colon cancer in his paternal aunt.     SOCIAL HISTORY:    reports that he has never smoked. He has never used smokeless tobacco. REVIEW OF SYSTEMS:REVIEW OF SYSTEMS:  HEENT: Denies headache, dizziness, tinnitus, sore throat, eye pain, loss of hearing. Respiratory System: Denies cough, sputum production, SOB. Cardiac System: Denies chest pain, SOB, PND, dyspnea on exertion, palpitations. GI System: As per present history and denies chronic liver disease. GU System: Denies dysuria, hematuria, pyuria, urinary frequency. Musculoskeletal: Denies joint pain or swelling. Dermatologic: Denies rashes, sores, or ulcerations on the skin. Neurological: Denies muscle weakness or paresthesias     PHYSICAL EXAMINATION:     Vitals: There were no vitals taken for this visit.   Constitutional: Alert; no apparent distress.   Skin: Skin color, texture, and turgor normal. No rashes or lesions.   Head: Normocephalic, atraumatic and without obvious abnormality.  .                     Mucous membranes are moist   Eyes: Conjunctivae and corneas clear. Pupils equal, EOM's intact.    Nose: Nares normal. Septum midline.            Throat: Clear oropharynx. No erythema, exudate, or plaques seen.    Mouth: No perioral lesions.  Tongue is normal.  Normal dentition.    Neck: No adenopathy, trachea midline, no tenderness/mass/nodules.  And no thyromegaly.   Lungs: Clear to auscultation bilaterally.  No wheezes or rhonchi.   Heart: Regular rate and rhythm. No murmur.   Abdomen: Abdomen is soft, non-tender, and non-distended. No            rebound or guarding.* liver and spleen were not palpable. no masses            felt.     Musculoskeletal: Unremarkable.   Extremities: No cyanosis or edema.   Pulses: 2+ and symmetric.   Lymph Nodes: Cervical, supraclavicular, and axillary nodes normal.   Neurologic: Grossly normal.    Psychiatric: Oriented to time, place and person.     LABORATORY:  Reviewed  Lab Results   Component Value Date    WBC 6.78 10/18/2018    HGB 15.4 10/18/2018    HCT 46.6 10/18/2018    MCV 96.9 10/18/2018    PLT 199 10/18/2018     Lab Results Component Value Date    CREAT 1.07 10/18/2018    BUN 18 10/18/2018    NA 138 10/18/2018    K 4.7 10/18/2018    CL 100 10/18/2018    CO2 26 10/18/2018    ALT 19 10/18/2018    AST 28 10/18/2018    ALKPHOS 55 10/18/2018    BILITOT 0.8 10/18/2018    ALBUMIN 4.7 10/18/2018        IMPRESSION:  Chronic diarrhea      Discussion:  ** although the patient's initial complaint was diarrhea for for 5 weeks, actually he has had symptoms for several years with marked increase in severity of symptoms over the past several weeks.  Diarrhea occurs on a daily basis and more recently there has been blood on the paper towel as well as in the stool.  I am strongly suspicious that we are dealing with inflammatory bowel disease.  For further evaluation stool will be obtained for ova parasites and calprotectin and blood will be obtained for CBC major Chem panel and CRP.  Pending the results of these further recommendations will be made.  More likely the patient will require colonoscopy.  I have explained all this to him.  Joslyn Devon, MD MPH  05/22/2019 12:18 PM  Associate Professor Medicine  [facility]  [address]  [address]  [place], [place] [address]    Phone: [phone]  Fax: [phone]

## 2019-05-22 NOTE — Telephone Encounter
PDL Call to Practice    Reason for Call: patient has questions about stool tests, spoke with insurance and was given further info that he would like to discuss with MA he spoke to today.  Please assist, thank you !    MD: Layla Barter    Appointment Related?  []  Yes  [x]  No     If yes;  Date:  Time:    Call warm transferred to PDL: [x]  Yes  []  No    Call Received by Practice Representative:    Renae Fickle

## 2019-05-23 LAB — Comprehensive Metabolic Panel
ANION GAP: 12 mmol/L (ref 8–19)
ANION GAP: 12 mmol/L (ref 8–19)

## 2019-05-23 LAB — C-Reactive Protein: C-REACTIVE PROTEIN: <0.3 mg/dL (ref <0.8)

## 2019-05-23 LAB — CBC: RED CELL DISTRIBUTION WIDTH-CV: 11.9 (ref 11.1–15.5)

## 2019-05-24 NOTE — Telephone Encounter
Spoke with Ray Church at [phone] after being redirected by Delfino Lovett of [employer].  Submitted auth for calprotectin and faxed office notes to [phone]  Ref# [id number]  F/U: [phone]

## 2019-05-27 ENCOUNTER — Ambulatory Visit: Payer: BLUE CROSS/BLUE SHIELD

## 2019-07-05 ENCOUNTER — Ambulatory Visit: Payer: BLUE CROSS/BLUE SHIELD

## 2019-07-05 ENCOUNTER — Telehealth: Payer: BLUE CROSS/BLUE SHIELD

## 2019-07-05 ENCOUNTER — Ambulatory Visit: Payer: BLUE CROSS/BLUE SHIELD | Attending: Student in an Organized Health Care Education/Training Program

## 2019-07-05 DIAGNOSIS — K625 Hemorrhage of anus and rectum: Secondary | ICD-10-CM

## 2019-07-05 DIAGNOSIS — K649 Unspecified hemorrhoids: Secondary | ICD-10-CM

## 2019-07-05 DIAGNOSIS — K5900 Constipation, unspecified: Secondary | ICD-10-CM

## 2019-07-05 MED ORDER — HYDROCORTISONE (PERIANAL) 2.5 % EX CREA
TOPICAL | 1 refills | 20.00000 days | Status: AC
Start: 2019-07-05 — End: ?

## 2019-07-05 MED ORDER — NA SULFATE-K SULFATE-MG SULF 17.5-3.13-1.6 GM/177ML PO SOLN
0 refills | 1.00000 days | Status: AC
Start: 2019-07-05 — End: ?

## 2019-07-05 NOTE — Progress Notes
GASTROENTEROLOGY CLINIC FOLLOW UP NOTE       07/05/2019 1:52 PM     Last seen: 05/22/19    Chief Complaint: rectal bleeding.      Interval History: Jonathan Weeks is a 23 y.o. male who is a patient of Dr. Dawayne Patricia. He was seen 10/21 with complaints of diarrhea, cramping and blood in stool.  He had labs drawn which were normal (CBC, CMP, CRP). His calprotectin was elevated at 96 and O&P was normal.  There was plan for colonoscopy but this was not done. Patient reports that he never received the results.  He is here to establish care as he reports this location is closer to him.  He states that his diarrhea resolved because he realized his food triggers. He reports that he does not eat rice, bread, pasta and diarrhea and so is no longer having loose stools.  He was having type 5-6 stools 3-4 times per day went away. He states that these loose stools have resolved.  Now he is having bristol type 2-3 stools three times per day and has significant amounts of pushing and straining.  Some days he has pain to use the restroom. Was having diarrhea on and off for 4 years.  Initially blood with wiping and blood in toilet.   Has pain in rectum and itchiness.  Also itching with working out. Maternal uncle with colon cancer at 65.   No other complaints.    Very difficult to get history.  Patient is under a lot of stress with family issues and also is in the [employer] and has gone through a lot.  He does not wish to speak to a therapist at this time.      Patient Active Problem List   Diagnosis   ? Chronic diarrhea       Outpatient Medications Prior to Visit   Medication Sig   ? clindamycin-benzoyl peroxide 1-5% gel Apply topically every morning Apply to affected areas..   ? clobetasol 0.05% external solution Apply topically two (2) times daily Scalp.  Avoid face.  Monday-Friday..   ? ketoconazole 2% shampoo Apply to scalp.  Leave on 5-10 minutes, then wash off.  Use 3/week.. ? tretinoin 0.025% cream Apply topically at bedtime Apply to affected areas..     No facility-administered medications prior to visit.        Review of Systems  Constitutional: Negative for fevers, chills, fatigue and malaise  Eyes: negative for visual disturbance  ENT: negative for tinnitus, nasal congestion, sore mouth, sore throat, hoarseness and voice change  Respiratory: negative for cough, sputum, hemoptysis, dyspnea on exertion or chronic bronchitis  Cardiovascular: Negative chest pain, sob.  Gastrointestinal:see hpi  Genitourinary:negative for dysuria, nocturia, urinary incontinence, hematuria  Hematologic/lymphatic: negative for easy bruising, bleeding, lymphadenopathy and petechiae  Musculoskeletal: negative for myalgias, arthralgias and muscle weakness  Neurological: negative for headaches, dizziness, vertigo and seizures    Physical Exam:  BP 130/69  ~ Pulse 75  ~ Resp 18  ~ Ht 5' 10'' (1.778 m)  ~ Wt 193 lb 12.8 oz (87.9 kg)  ~ SpO2 97%  ~ BMI 27.81 kg/m?   Wt Readings from Last 3 Encounters:   07/05/19 193 lb 12.8 oz (87.9 kg)   10/18/18 173 lb 12.8 oz (78.8 kg)   04/20/16 155 lb (70.3 kg) (53 %, Z= 0.07)*     * Growth percentiles are based on CDC (Boys, 2-20 Years) data.     Gen: Not distressed, appropriate  HEENT: Anicteric sclera,OP clear, no lesions.  Abdomen: Soft, non tender, non distended, bowel sounds are normal, no hepatosplenomegaly  Ext: No edema, no cyanosis, No clubbing  Musculoskeletal: Ambulates, no joint redness, tenderness  Skin: No rashes, lesions.  Neurologic: A&O x3, Grossly nonfocal.  Rectal exam: small possible healed anal fissure at 12 o clock position. Small external hemorrhoid at 6 o clock position which is where patient has pain. No masses felt on rectal exam. ANOSCOPY PROCEDURE: With the patient in the left lateral decubitus position, the anoscope was introduced into the anus and the anal canal and distal rectum inspected.  The anoscope was subsequently withdrawn without complication.  The findings are as follows: small internal hemorrhoids, patient cannot tolerate full insertion of anoscope  Chaperone: Delia Acero     LABS  Lab Results   Component Value Date    WBC 4.83 05/22/2019    HGB 15.2 05/22/2019    HCT 45.5 05/22/2019    MCV 94.8 05/22/2019    PLT 185 05/22/2019     Lab Results   Component Value Date    CREAT 1.06 05/22/2019    BUN 21 05/22/2019    NA 141 05/22/2019    K 5.0 05/22/2019    CL 103 05/22/2019    CO2 26 05/22/2019     Lab Results   Component Value Date    ALT 35 05/22/2019    AST 27 05/22/2019    ALKPHOS 58 05/22/2019    BILITOT 0.4 05/22/2019         Interval Imaging/Endoscopies:  No prior             Assessment/Plan:  [name] is a 23 y.o. male who is here for evaluation of:     #rectal pain, itching: may be due to hemorrhoids and passage of hard stools although they do not seem large enough to be causing the pain described.  ddx includes tenesmus given elevated calprotectin and possible IBD. Less likely proctalgia fugax.   #constipation, alternating bowel habits: patient likely has constipation given type 2-3 stools most days, straining and pushing. Will evaluate for celiac given less loose stools with avoiding gluten but I believe diarrhea is likely overflow in setting of constipation.   #elevated calprotectin, ?chronic diarrhea: previously saw Dr. Richardson Dopp with diarrhea but history now suggestive of constipation with overflow, given elevated calprotectin and rectal bleeding will need evaluation for IBD     --schedule colonoscopy to r/o IBD (perform with MAC, Suprep) --patient is currently gluten free so recommended to do gluten challenge and test for celiac with serology and/or EGD with biopsies given he had improvement of loose stools with gluten free diet however patient does not want to ingest any gluten. Will order HLA testing.   --patient offered referral to therapy due to family and life stressors but does not wish to talk to therapist at this time   --constipation handout given  --start benefiber 1 tbsp in 8 oz of liquid   --anusol ointment twice daily x 3 weeks   --follow up in 3 months   --may need to consider colorectal evaluation in future if no clear source of bleeding/pain found      30 minutes were spent with the patient of which at least 50% was spent on counseling and coordination of care for the contents and topics discussed above.      Josslin Sanjuan M. Betti Cruz, MD  [facility]   07/05/2019 1:52 PM

## 2019-07-05 NOTE — Telephone Encounter
Called pt to offer appt in our [place] location  I see pt has been scheduled with Dr. Reece Levy

## 2019-07-05 NOTE — Patient Instructions
It was a pleasure meeting with you today in the [facility].  During our visit we discussed the following:    --blood test today   --schedule colonoscopy with miralax prep   --start anusol cream twice daily x 3 weeks  --start benefiber 1 tbsp in 8 oz of liquid   --follow up in 3 months     Please call the clinic with any questions or concerns. [phone]    Shahan Starks M. Betti Cruz, MD   [facility]      Constipation (Adult)  Constipation means that you have bowel movements that are less frequent than usual. Stools often become very hard and difficult to pass.  Constipation is very common. At some point in life, it affects almost everyone. Since everyone's bowel habits are different, what is constipation to one person may not be to another. Your healthcare provider may do tests to diagnose constipation. It depends on what?he or she?finds when evaluating you.    Symptoms of constipation include:  ? Abdominal pain  ? Bloating  ? Vomiting  ? Painful bowel movements  ? Itching, swelling, bleeding, or pain around the anus  Causes  Constipation can have many causes. These include:  ? Diet low in fiber  ? Too much dairy  ? Not drinking enough liquids  ? Lack of exercise or physical activity (especially true for older adults)  ? Changes in lifestyle or daily routine, including pregnancy, aging, work, and travel  ? Frequent use or misuse of laxatives  ? Ignoring the urge to have a bowel movement or delaying it until later  ? Medicines, such as certain prescription pain medicines, iron supplements, antacids, certain antidepressants, and calcium supplements  ? Diseases like irritable bowel syndrome, bowel obstructions, stroke, diabetes, thyroid disease, Parkinson disease, hemorrhoids, and colon cancer  Complications  Potential complications of constipation can include:  ? Hemorrhoids  ? Rectal bleeding from hemorrhoids or anal fissures?(skin tears)  ? Hernias  ? Dependency on laxatives ? Chronic constipation  ? Fecal impaction, a severe form of constipation in which a large amount of hard stool is in your rectum that you can't pass  ? Bowel obstruction or perforation  Home care  All treatment should be done after talking with your healthcare provider. This is especially true if you have another medical problems, are taking prescription medicines, or are an older adult. Treatment most often involves lifestyle changes. You may also need medicines. Your healthcare provider will tell you which will work best for you. Follow the advice below to help avoid this problem in the future.  Lifestyle changes  These lifestyle changes can help prevent constipation:  ? Diet. Eat a high-fiber diet, with fresh fruit and vegetables, and reduce dairy intake, meats, and processed foods  ? Fluids. It's important to get enough fluids each day. Drink plenty of water when you eat more fiber. If you are on diet that limits the amount of fluid you can have, talk about this with your healthcare provider.  ? Regular exercise. Check with your healthcare provider first.  Medicines  Take any medicines as directed. Some laxatives are safe to use only every now and then. Others can be taken on a regular basis. While laxatives don't cause bowel dependence, they are treating the symptoms. So your constipation may return if you don't make other changes. Talk with your healthcare provider or pharmacist if you have questions.  Prescription pain medicines can cause constipation. If you are taking  this kind of medicine, ask your healthcare provider if you should also take a stool softener.  Medicines you may take to treat constipation include:  ? Fiber supplements  ? Stool softeners  ? Laxatives  ? Enemas  ? Rectal suppositories  Follow-up care  Follow up with your healthcare provider if symptoms don't get better in the next few days. You may need to have more tests or see a specialist.  Call 911  Call 911 if any of these occur: ? Trouble breathing  ? Stiff, rigid abdomen that is severely painful to touch  ? Confusion  ? Fainting or loss of consciousness  ? Rapid heart rate  ? Chest pain  When to seek medical advice  Call your healthcare provider right away if any of these occur:  ? Fever of 100.4?F (38?C) or higher, or as directed by your healthcare provider  ? Failure to resume normal bowel movements  ? Pain in your abdomen or back gets worse  ? Nausea or vomiting  ? Swelling in your abdomen  ? Blood in the stool  ? Black, tarry stool  ? Involuntary weight loss  ? Weakness  Date Last Reviewed: 12/30/2016  ? 2000-2018 [employer], [place]. [address], [place], [place] [address]. All rights reserved. This information is not intended as a substitute for professional medical care. Always follow your healthcare professional's instructions.          Understanding Hemorrhoids    Hemorrhoid tissues are ?cushions? of blood vessels that swell slightly during bowel movements. Too much pressure on the anal canal can make these tissues remain enlarged, become inflamed, and cause symptoms. This can happen?both inside and outside the anal canal.  Parts of the anal canal  The parts of the anal canal are:  ? Internal hemorrhoid tissue?is in the upper area of the anal canal.  ? The rectum?is the last several inches of the colon. This is where stool is stored prior to bowel movements.  ? Anal sphincters?are ring-shaped muscles that expand and contract to control the anal opening.  ? External hemorrhoid tissue?lies under the anal skin.  ? The anus?is the passage between the rectum and the outside of the body.  Normal hemorrhoid tissue Hemorrhoid tissues play an important role in helping your body eliminate waste. Food passes from the stomach through the intestines. The waste (stool) then travels through the colon to the rectum. It is stored in the rectum until it?s ready to be passed from the anus. During bowel movements, hemorrhoids swell with blood and become slightly larger. This swelling helps protect and cushion the anal canal as stool passes from the body. Once the stool has passed, the tissues stop swelling and return to normal.  Problem hemorrhoids  Pressure due to straining or other factors can cause hemorrhoid tissues to remain swollen. When this happens to the hemorrhoid tissues in the anal canal they?re called internal hemorrhoids. Swollen tissues around the anal opening are called external hemorrhoids. Depending on the location, your symptoms can differ.  ? Internal hemorrhoids?often happen in clusters around the wall of the anal canal. They are usually painless. But they may prolapse (protrude out of the anus) due to straining or pressure from hard stool. After the bowel movement is over, they may then reduce (return inside the body). Internal hemorrhoids often bleed. They can also discharge mucus.  ?   External hemorrhoids?are located at the anal opening, just beneath the skin. These tissues rarely cause problems unless they  thrombose (form a blood clot). When this happens, a hard, bluish lump may appear. A thrombosed hemorrhoid also causes sudden, severe pain. In time, the clot may go away on its own. This sometimes leaves a ?skin tag? of tissue stretched by the clot.  Hemorrhoid symptoms  Hemorrhoid symptoms may include:  ? Pain or a burning sensation  ? Bleeding during bowel movements  ? Protrusion of tissue from the anus  ? Itching around the anus  Causes of hemorrhoids  There?s no single cause of hemorrhoids. Most often, though, they are caused by too much pressure on the anal canal. This can be due to: ? Chronic (ongoing) constipation  ? Straining during bowel movements  ? Sitting too long on the toilet  ? Strenuous exercise or heavy lifting  ? Pregnancy and childbirth  ? Aging  ? Diarrhea  Date Last Reviewed: 01/30/2015  ? 2000-2018 [employer], [place]. [address], [place], [place] [address]. All rights reserved. This information is not intended as a substitute for professional medical care. Always follow your healthcare professional's instructions.

## 2019-07-06 ENCOUNTER — Telehealth: Payer: BLUE CROSS/BLUE SHIELD

## 2019-07-06 ENCOUNTER — Ambulatory Visit: Payer: BLUE CROSS/BLUE SHIELD

## 2019-07-06 NOTE — Progress Notes
Called patient to follow up on symptoms. Patient did not answer, left message stating to send me a message over mychart on his symptoms.     When evaluated yesterday no clear abscess seen and pain only where there is a small external hemorrhoid.  If pain worsens can consider imaging given patient may have underlying ibd and could have abscess.      Oline Belk M. Reece Levy, MD  [facility]   07/06/2019 12:25 PM

## 2019-07-08 ENCOUNTER — Telehealth: Payer: BLUE CROSS/BLUE SHIELD

## 2019-07-08 ENCOUNTER — Ambulatory Visit: Payer: BLUE CROSS/BLUE SHIELD

## 2019-07-08 ENCOUNTER — Inpatient Hospital Stay: Payer: BLUE CROSS/BLUE SHIELD | Attending: Student in an Organized Health Care Education/Training Program

## 2019-07-08 DIAGNOSIS — Z01812 Encounter for preprocedural laboratory examination: Secondary | ICD-10-CM

## 2019-07-08 LAB — Container for Possible Addon

## 2019-07-08 NOTE — Telephone Encounter
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 Betti Cruz    Staff, please check off all the options applicable before routing the encounter:       [x]  Patient has been informed about the procedure date and time.     [x]  Patient has been provided check in instructions, to bring responsible adult to procedure.    [x]  Provided Prep Instructions, based on orders.     [x]  Reviewed if RX prep was prescribed if applicable to [facility].      [x]  Ensured insurance turnaround STAT, or Routine were flagged accordingly.     []  If [facility] patient, offered Monday Conscious Sedation, if not willing to pay 200.00 for MAC.     []   Any other type of insurance (Except Medical), please inform patient, MAC is usually a non-covered service. $200.00 is the fee for the professional Anesthesia services.  Conscious Sedation is included in their procedure, but not offered at [facility], or [facility] MPU.   Only at [employer] locations, such as [address], [place], [place].         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 Patient has been informed about the $200.00 OUT OF POCKET  expense for MAC and patient has agreed to : SIGN ABN UPON CHECK IN AND GET BILLED    [facility] if the slot is booked prior to scheduling request being fulfilled, please reach out to the patient to coordinate new procedure date and time.       Reminder to all Clinic Staff?If you are coordinating specific procedure time and date with patient and MD, it is your responsibility to confirm these details with the patient. If MPU schedulers are communicating directly with the patient it is their responsibility to confirm the appointment.              MAC SCRIPT As you may be aware, there are two sedation options that are used for colonoscopy and upper gastrointestinal endoscopy. One form of sedation consists of a combination of medicines (Versed and Fentanyl) administered by a registered nurse (RN) under the supervision of your gastroenterologist.  This form of sedation is included in your gastroenterologist?s fee.   The other form of sedation is ?intravenous sedation? with the use of Propofol (sedative) administered by a specially trained physician called an anesthesiologist, also known as MAC (Monitored Anesthesia Care). Propofol has a rapid onset and short duration, compared to Versed and Fentanyl, and provides complete sedation, whereas with conscious sedation this is not guaranteed. Propofol also has potent anti-nausea properties, so one tends to awake quickly and without any ill effects.               Effective July 1st, 2017 the [facility] Medical Procedure unit in [facility] and [place] will only perform cases with Monitored Anesthesia Care (Propofol) for endoscopy procedures.   This type of sedation is not always covered by health plans. We will submit an authorization request if necessary for this type of sedation, but approval will be based on the medical guideline for that insurance carrier, and  there are limited medical conditions that would be considered appropriate to cover an anesthesiologist which include ?severe systematic diseases? such as significant heart and/or lung diseases, sever insulin dependent diabetes, sleep apnea, and morbid obesity. Other conditions  that may require an anesthesiologist include a history of drug or alcohol abuse, chronic use of Valium type medications such as Ativan, Xanax, or Clonopin, or a prior documented history of previous problems with conscious sedations (Versed and Fentanyl) or anesthesia.  Please refer to your health plan medical guideline for specifics on their anesthesia coverage determination.

## 2019-07-08 NOTE — Telephone Encounter
Reply by: Elvera Bicker    Thank you, sent message to [facility] too.

## 2019-07-08 NOTE — Telephone Encounter
Reply by:  Nia   Procedure scheduled and covid order pended to MD.

## 2019-07-08 NOTE — Telephone Encounter
Please review and sign pended COVID order.

## 2019-07-09 ENCOUNTER — Ambulatory Visit: Payer: BLUE CROSS/BLUE SHIELD

## 2019-07-09 ENCOUNTER — Telehealth: Payer: BLUE CROSS/BLUE SHIELD

## 2019-07-09 LAB — Celiac Genetics: DQB1 IR LOCI 2: 7

## 2019-07-09 NOTE — Telephone Encounter
Left message to provide patient with covid testing line [phone], to schedule testing 1-2 days prior to scheduled colonoscopy procedure on 07/16/2019 with Dr. Reddy.

## 2019-07-09 NOTE — Telephone Encounter
Confirmation Documentation   (left msg) procedure(s)   Date:12/15  Time:0900  Check-in Time:0800  Please schedule COVID test     Colonoscopy  x   Endoscopy     Bravo    Cath Placement    Sigmoidoscopy     Small Bowel Entero.    Esophageal Manometry    Anorectal Manometry    Biofeedback    pH study    Capsule Endoscopy    Secretin Infusion Test    Sham Feeding Study        Informed pt:  1. Of Arrival time (time varies based on location)?  2. Of location and room number?  3. Make sure there is a confirmed ride for the procedure.  4. Has procedure been authorized?Pending   5. Was MAC Script provided?   6. Reviewed Prep instructions with patient? Y/N?  7. Informed patient to call back with any questions and provided number.Y/N?    NOTE: If patients have any questions regarding prescribed medication patient needs to contact their prescribing physician to ensure it is ok to stop medications.      Comments/Notes:        If VM is left and pt calls back, please warm transfer patient to [phone]. This number is for internal use only and is not to be provided to patients.

## 2019-07-10 ENCOUNTER — Ambulatory Visit: Payer: BLUE CROSS/BLUE SHIELD

## 2019-07-10 ENCOUNTER — Ambulatory Visit (INDEPENDENT_AMBULATORY_CARE_PROVIDER_SITE_OTHER): Payer: Self-pay | Admitting: Family Medicine

## 2019-07-10 ENCOUNTER — Encounter: Payer: Self-pay | Admitting: Family Medicine

## 2019-07-10 ENCOUNTER — Other Ambulatory Visit: Payer: Self-pay

## 2019-07-10 VITALS — BP 130/80 | HR 70 | Temp 98.6°F | Ht 72.0 in | Wt 190.0 lb

## 2019-07-10 DIAGNOSIS — E0590 Thyrotoxicosis, unspecified without thyrotoxic crisis or storm: Secondary | ICD-10-CM

## 2019-07-10 DIAGNOSIS — M79641 Pain in right hand: Secondary | ICD-10-CM

## 2019-07-10 DIAGNOSIS — F419 Anxiety disorder, unspecified: Secondary | ICD-10-CM

## 2019-07-10 MED ORDER — ARIPIPRAZOLE 2 MG PO TABS: 2.0000 mg | ORAL_TABLET | Freq: Every day | ORAL | 0 refills | Status: DC

## 2019-07-10 NOTE — Progress Notes (Signed)
BP 130/80   Pulse 70   Temp 98.6 F (37 C) (Oral)   Ht 6' (1.829 m)   Wt 190 lb (86.2 kg)   SpO2 97%   BMI 25.77 kg/m    Subjective:    Patient ID: Jose Pena, male    DOB: September 22, 1995, 23 y.o.   MRN: [id number]  HPI: Jose Pena is a 23 y.o. male  Chief Complaint  Patient presents with  . Anxiety   States he's dealt with anxiety issues for many years but things have gotten worse the past 3-4 months. Good energy levels, but eating patterns and sleep are off. Feeling lots of emotion, very sad, very mad, or very happy, racing thoughts. Feels COVID and isolation seems to have made things worse. Had been on zoloft around age 68 but did not like it because he felt like it took his personality away and made him gain weight.   Hx of hyperthyoidism, had seen Endocrinology back in 2018 and was on medication for a while but stopped it due to weight gain. Has not had labs since then.   Right hand pain after a fall he recently experienced. Has an old boxers fracture in the right hand as well, feels this re-injured things. Some swelling, no bruising. Has wrist and hand in brace he was given for the previous injury. Does not wish for imaging today.   Relevant past medical, surgical, family and social history reviewed and updated as indicated. Interim medical history since our last visit reviewed. Allergies and medications reviewed and updated.  Review of Systems  Per HPI unless specifically indicated above     Objective:    BP 130/80   Pulse 70   Temp 98.6 F (37 C) (Oral)   Ht 6' (1.829 m)   Wt 190 lb (86.2 kg)   SpO2 97%   BMI 25.77 kg/m   Wt Readings from Last 3 Encounters:  07/10/19 190 lb (86.2 kg)  03/06/18 199 lb 12.8 oz (90.6 kg)  12/21/16 185 lb (83.9 kg) (84 %, Z= 1.00)*   * Growth percentiles are based on CDC (Boys, 2-20 Years) data.    Physical Exam Vitals and nursing note reviewed.  Constitutional:      Appearance: Normal appearance.   HENT:     Head: Atraumatic.  Eyes:     Extraocular Movements: Extraocular movements intact.     Conjunctiva/sclera: Conjunctivae normal.  Neck:     Comments: No thyromegaly or obvious nodules palpable Cardiovascular:     Rate and Rhythm: Normal rate and regular rhythm.  Pulmonary:     Effort: Pulmonary effort is normal.     Breath sounds: Normal breath sounds.  Musculoskeletal:        General: Normal range of motion.     Cervical back: Normal range of motion and neck supple.  Skin:    General: Skin is warm and dry.  Neurological:     General: No focal deficit present.     Mental Status: He is oriented to person, place, and time.  Psychiatric:        Mood and Affect: Mood normal.        Thought Content: Thought content normal.        Judgment: Judgment normal.     Results for orders placed or performed in visit on 07/10/19  TSH  Result Value Ref Range   TSH 0.360 (L) 0.450 - 4.500 uIU/mL      Assessment & Plan:  Problem List Items Addressed This Visit      Endocrine   Hyperthyroidism - Primary    Recheck TSH. Patient experienced weight gain on medication and would be leery to get back on      Relevant Orders   TSH (Completed)     Other   Anxiety    Will trial abilify and monitor closely for benefit. Risks and benefits reviewed. F/u in 1 month       Other Visit Diagnoses    Right hand pain       Declines imaging at this time, discussed to call if changing mind. Brace, RICE protocol.        Follow up plan: Return in about 4 weeks (around 08/07/2019) for Mood and anxiety.

## 2019-07-10 NOTE — Telephone Encounter
I thought I placed an encounter, but when speaking with pt he stated pain is less. I'll reach out to patient again to reschedule.

## 2019-07-11 LAB — TSH: TSH: 0.36 u[IU]/mL — ABNORMAL LOW (ref 0.450–4.500)

## 2019-07-11 NOTE — Telephone Encounter
[facility] Please cancel patients scheduled procedure.

## 2019-07-11 NOTE — Telephone Encounter
Called pt, LVM to assist with rescheduling procedure.

## 2019-07-14 DIAGNOSIS — F419 Anxiety disorder, unspecified: Secondary | ICD-10-CM | POA: Insufficient documentation

## 2019-07-14 NOTE — Assessment & Plan Note (Signed)
Will trial abilify and monitor closely for benefit. Risks and benefits reviewed. F/u in 1 month

## 2019-07-14 NOTE — Assessment & Plan Note (Signed)
Recheck TSH. Patient experienced weight gain on medication and would be leery to get back on

## 2019-08-07 ENCOUNTER — Telehealth: Payer: Self-pay | Admitting: Family Medicine

## 2019-08-07 NOTE — Telephone Encounter (Signed)
Routing to provider  

## 2019-08-07 NOTE — Telephone Encounter (Signed)
Pt called stating he is leaving out of town and that he is needing this medication today. Please advise.

## 2019-08-07 NOTE — Telephone Encounter (Signed)
Due for 1 month f/u as this is a new medication, can be virtual. Will need to do this for further refills and should be done ASAP. I will refill for 30 days.

## 2019-08-07 NOTE — Telephone Encounter (Signed)
Unable to LVM for patient.

## 2019-08-08 ENCOUNTER — Encounter: Payer: Self-pay | Admitting: Family Medicine

## 2019-08-08 NOTE — Telephone Encounter (Signed)
Letter mailed to pt.  

## 2019-08-08 NOTE — Telephone Encounter (Signed)
Routing to close.  °

## 2019-08-30 ENCOUNTER — Ambulatory Visit: Payer: BLUE CROSS/BLUE SHIELD | Attending: Gastroenterology

## 2019-08-30 ENCOUNTER — Ambulatory Visit: Payer: BLUE CROSS/BLUE SHIELD

## 2019-08-30 ENCOUNTER — Telehealth: Payer: Self-pay

## 2019-08-30 DIAGNOSIS — R197 Diarrhea, unspecified: Secondary | ICD-10-CM

## 2019-08-30 DIAGNOSIS — K625 Hemorrhage of anus and rectum: Secondary | ICD-10-CM

## 2019-08-30 MED ORDER — ARIPIPRAZOLE 2 MG PO TABS: 2.0000 mg | ORAL_TABLET | Freq: Every day | ORAL | 0 refills | Status: DC

## 2019-08-30 MED ORDER — NA SULFATE-K SULFATE-MG SULF 17.5-3.13-1.6 GM/177ML PO SOLN
0 refills | 1.00000 days | Status: AC
Start: 2019-08-30 — End: ?

## 2019-08-30 NOTE — Telephone Encounter (Signed)
I sent through enough to get to his scheduled appointment- no need to change

## 2019-08-30 NOTE — Telephone Encounter (Signed)
Copied from CRM [id number]. Topic: General - Other >> Aug 30, 2019 12:14 PM Jaquita Rector A wrote: Reason for CRM: Ms Darel Hong patient DIYM and guardian called to schedule him an appointment with Roosvelt Maser for his medication refill, but her first available appointment is not until 09/09/19 and he will be out of med's by then. She is asking what she can do or if Fleet Contras can make space on her schedule to work him in before he run out of medication next week. Please call Ms Darel Hong at Ph# [phone]

## 2019-08-30 NOTE — Patient Instructions
1. Lets plan to do an endoscopy and colonoscopy - tentatively for Feb 17 so we can re-introduce gluten into your diet.   2. Until then it would be good to re-introduce gluten. You need about 3 g of gluten a day. This is 2 slices of wheat bread or 2 cups of pasta a day. We will take biopsies to confirm if you have celiac disease.   3. You will need a ride for the procedures. You will need a covid test 24-48 hrs before.

## 2019-08-30 NOTE — Consults
OUTPATIENT GASTROENTEROLOGY NEW PATIENT CONSULT NOTE  08/30/2019    CONSULTANT:  Dr. Tedra Coupe  REFERRING PROVIDER: Self-Referred    Reason for Consultation:   Chronic GI issues    History of Present Illness:   The patient is 24 y.o.  male here for provider transfer.    Seen by Dr. Oletha Blend in 2016 for abdominal pain and blood with wiping. Was recommended to get a sigmoidoscopy but it was not done.   He then came to see Dr. Richardson Dopp on 05/22/19. At that time he was having diarrhea with rectal bleeding. Calprotectin was 96 and a colonoscopy was recommended.   Then he saw Dr. Betti Cruz on 07/05/2019 complained of anal itching and alternating bowel habits. Colonoscopy was recommended. 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 He had colonoscopy scheduled but it was cancelled by patient.     Today he is here to establish care with myself. He say he would like to have a colonoscopy now.     His history that was discussed today includes:   - Been having rectal pain going to the bathroom. Has happened for 1-2 years. Did not want rectal exam today.   - Even as a kid would get blood in the stools. Mainly sees blood when he wipes not mixed with the stools.   - Currently - having 4-5 soft BMs a day. Sometimes wakes up to go the the bathroom in the night. +Urgency. No fecal accidents. Stools are more erratic. No weight loss, hx of skin rashes, or nausea/vomiting  - 2-3 years ago - went to the hospital for digestive issues but believes he was only seen in the ER.    - FHx: Uncle had colon cancer at age 36. Believes uncle may have had ulcerative colitis. No family history of celiac disease.   - He has continued to avoid gluten. Saw a dietician who did skin testing? And said he was allergic to bread.   - Never had a colonoscopy or endoscopy.   - Social Hx: Planning on going to culinary school in May 2021, would like to get his GI symptoms under control before then    Review of Systems: A 14- point review of systems was negative except where noted in the HPI    Physical Examination:   Vitals: BP 131/68  ~ Pulse 76  ~ Resp 18  ~ Ht 5' 10'' (1.778 m)  ~ Wt 201 lb (91.2 kg)  ~ SpO2 97%  ~ BMI 28.84 kg/m?    Constitutional:WD/WN alert, appears stated age and cooperative  HEENT: Normocephalic, atraumatic.  Anicteric sclera, pink conjunctiva.  Mucous membranes moist without lesions, pharnyx clear.  Neck: Supple.  No masses.  Thyroid normal.  Heart: RRR.  Normal S1 S2 no S3 S4.  No murmurs.  Lungs: Clear to auscultation and percussion  Back:  No CVA or spinal tenderness  Abdomen: Abdomen is flat and soft with normal active bowel sounds. No tenderness, rebound or guarding. No appreciable hepatosplenomegaly.   Rectal: Deferred by patient  Extremities: extremities normal, without clubbing, cyanosis or edema  Skin: Skin color, texture, turgor normal. No rashes or lesions  Neurologic:  Awake, alert and oriented.    Laboratory/Imaging:    I have   [x] reviewed radiology,  [x] reviewed labs, [x] reviewed diag med test, [] reviewed & summarized old records, [] requested outside medical records.     Lab Results   Component Value Date    WBC 4.83 05/22/2019    HGB  15.2 05/22/2019    HCT 45.5 05/22/2019    MCV 94.8 05/22/2019    PLT 185 05/22/2019     Lab Results   Component Value Date    CREAT 1.06 05/22/2019    BUN 21 05/22/2019    NA 141 05/22/2019    K 5.0 05/22/2019    CL 103 05/22/2019    CO2 26 05/22/2019    BILITOT 0.4 05/22/2019    ALT 35 05/22/2019    AST 27 05/22/2019    ALKPHOS 58 05/22/2019    ALBUMIN 4.6 05/22/2019    TSH 1.3 02/11/2015     Lab Results   Component Value Date    CRP <0.3 05/22/2019    CALPROTSTL 96 (H) 05/26/2019     Results for orders placed or performed in visit on 03/25/15   H Pylori Urea Breath Test    Specimen: Respiratory, Upper   Result Value Ref Range    H Pylori Urea Breath Test NOT DETECTED      HLA testing (07/05/2019):   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 High risk antigen HLA-DQ8 ABSENT.   Impression & Recommendations:    JOENATHAN SAKUMA is a 24 y.o. male here for evaluation of the following:    Encounter Diagnoses   Name Primary?   ? Diarrhea, unspecified type Yes   ? Rectal bleeding      Patient with chronic GI issues including diarrhea, rectal bleeding, and rectal pain. He feels he is sensitive to gluten and given positive HLA testing, he is willing to undergo endoscopy with small bowel biopsies after 2 week gluten challenge along with the colonoscopy that was already ordered.     Plan:  - 3 g/day of gluten for at least 2 weeks  - Endoscopy with small bowel biopsies and ileocolonoscopy with biopsies under MAC anesthesia to evaluate symptoms  - Suprep re-ordered and instructions given   - Celiac serologies in 2 weeks after gluten re-introduction  - Follow up in clinic after procedures     Orders Placed This Encounter   ? GI Endoscopy Procedures   ? sodium sulfate-potassium sulfate-magnesium sulfate solution     More than 50% of the encounter time was spent with counseling and in the coordination of care for the contents and topics discussed above.  The above recommendations were discussed with the patient and/or their caregiver. The patient had all questions answered and expressed understanding of the plan.      SIGNED:  Karalee Height, MD  08/30/2019 7:59 AM      Medical Decision Making addressed on 08/30/2019:      Number and Complexity of Problems Addressed at the Encounter:   [x]   1 or more chronic illness with exacerbation, progression, or side effects of treatment  []   2 or more stable chronic illness  []   1 undiagnosed new problem with uncertain diagnosis  []   1 acute illness with systemic symptoms  []   1 acute complicated injury   Review of Data: I have  [x]  Reviewed/ordered ? 3 unique laboratory, radiology, and/or diagnostic tests noted    []  Reviewed prior external notes and incorporated into patient assessment as noted [x]  I have independently interpreted test performed by other physician(s) as noted   []  Discussed management or test interpretation with external provider(s) as noted   Risk of Complication and or Morbidity or Mortality of Patient Management including Social Determinants of Health:   [x]   I deem the above diagnoses to have a risk  of complication, morbidity or mortality of Moderate   [] The diagnosis or treatment of said conditions is significantly limited by social determinants of health as noted.

## 2019-09-02 ENCOUNTER — Telehealth: Payer: BLUE CROSS/BLUE SHIELD

## 2019-09-02 ENCOUNTER — Ambulatory Visit: Payer: BLUE CROSS/BLUE SHIELD

## 2019-09-02 NOTE — Telephone Encounter (Signed)
Judy notified and verbalized understanding.

## 2019-09-02 NOTE — Telephone Encounter
Called and lvm for pt to call office back to assist with scheduling.

## 2019-09-02 NOTE — Telephone Encounter
Called and lvm for pt to call back to schedule colo/egd with Dr. Peggye Ley on 09/18/19 at [facility] MPU, please assist when patient calls back.    Thank you.

## 2019-09-05 ENCOUNTER — Ambulatory Visit: Payer: BLUE CROSS/BLUE SHIELD

## 2019-09-05 ENCOUNTER — Inpatient Hospital Stay: Payer: BLUE CROSS/BLUE SHIELD | Attending: Gastroenterology

## 2019-09-05 ENCOUNTER — Telehealth: Payer: BLUE CROSS/BLUE SHIELD

## 2019-09-05 DIAGNOSIS — Z01812 Encounter for preprocedural laboratory examination: Secondary | ICD-10-CM

## 2019-09-05 NOTE — Telephone Encounter
Please review and sign pended COVID order.

## 2019-09-05 NOTE — Telephone Encounter
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 Peggye Ley, Wednesday 09/18/2019 at 12pm.    Is the patient currently symptomatic for COVID-19 as defined by CDC?   []  YES [x]  NO  [Fever or chills, Cough, Shortness of breath or difficulty breathing, Fatigue, Muscle or body aches, Headache, New loss of taste or smell, Sore throat , Congestion or runny nose, Nausea or vomiting, Diarrhea]          Staff, please check off all the options applicable before routing the encounter:       [x]  Patient has been informed about the procedure date and time.     [x]  Patient has been provided check in instructions, to bring responsible adult to procedure.    [x]  Provided Prep Instructions, based on orders.     [x]  Reviewed if RX prep was prescribed if applicable to [facility].      [x]  Ensured insurance turnaround STAT, or Routine were flagged accordingly.     []  If [facility] patient, offered Monday Conscious Sedation, if not willing to pay 200.00 for MAC.     []   Any other type of insurance (Except Medical), please inform patient, MAC is usually a non-covered service. $200.00 is the fee for the professional Anesthesia services.  Conscious Sedation is included in their procedure, but not offered at [facility], or [facility] MPU.   Only at [employer] locations, such as [address], [place], [place].         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 Patient has been informed about the $200.00 OUT OF POCKET  expense for MAC and patient has agreed to : SIGN ABN UPON CHECK IN AND GET BILLED    [facility] if the slot is booked prior to scheduling request being fulfilled, please reach out to the patient to coordinate new procedure date and time. Reminder to all Clinic Staff?If you are coordinating specific procedure time and date with patient and MD, it is your responsibility to confirm these details with the patient. If MPU schedulers are communicating directly with the patient it is their responsibility to confirm the appointment.              MAC SCRIPT  As you may be aware, there are two sedation options that are used for colonoscopy and upper gastrointestinal endoscopy. One form of sedation consists of a combination of medicines (Versed and Fentanyl) administered by a registered nurse (RN) under the supervision of your gastroenterologist.  This form of sedation is included in your gastroenterologist?s fee.   The other form of sedation is ?intravenous sedation? with the use of Propofol (sedative) administered by a specially trained physician called an anesthesiologist, also known as MAC (Monitored Anesthesia Care). Propofol has a rapid onset and short duration, compared to Versed and Fentanyl, and provides complete sedation, whereas with conscious sedation this is not guaranteed. Propofol also has potent anti-nausea properties, so one tends to awake quickly and without any ill effects. Effective July 1st, 2017 the [facility] Medical Procedure unit in [facility] and [place] will only perform cases with Monitored Anesthesia Care (Propofol) for endoscopy procedures.   This type of sedation is not always covered by health plans. We will submit an authorization request if necessary for this type of sedation, but approval will be based on the medical guideline  for that insurance carrier, and  there are limited medical conditions that would be considered appropriate to cover an anesthesiologist which include ?severe systematic diseases? such as significant heart and/or lung diseases, sever insulin dependent diabetes, sleep apnea, and morbid obesity. Other conditions that may require an anesthesiologist include a history of drug or alcohol abuse, chronic use of Valium type medications such as Ativan, Xanax, or Clonopin, or a prior documented history of previous problems with conscious sedations (Versed and Fentanyl) or anesthesia.  Please refer to your health plan medical guideline for specifics on their anesthesia coverage determination.

## 2019-09-05 NOTE — Telephone Encounter
Procedure scheduled and covid order pended.

## 2019-09-05 NOTE — Telephone Encounter
Reply by: Yanai Hobson N. Dink Creps  done

## 2019-09-10 ENCOUNTER — Ambulatory Visit: Payer: BLUE CROSS/BLUE SHIELD

## 2019-09-10 ENCOUNTER — Telehealth: Payer: BLUE CROSS/BLUE SHIELD

## 2019-09-10 DIAGNOSIS — Z113 Encounter for screening for infections with a predominantly sexual mode of transmission: Secondary | ICD-10-CM

## 2019-09-10 NOTE — Telephone Encounter
Confirmation Documentation   (confirmed) procedure(s)   Date: 02/17  Time: 12:00pm  Check-in Time: 11:00am    Colonoscopy  X   Endoscopy  X   Bravo    Cath Placement    Sigmoidoscopy     Small Bowel Entero.    Esophageal Manometry    Anorectal Manometry    Biofeedback    pH study    Capsule Endoscopy    Secretin Infusion Test    Sham Feeding Study        Informed pt:  1. Of Arrival time (time varies based on location)? Yes   2. Of location and room number? Yes   3. Make sure there is a confirmed ride for the procedure. Yes   4. Has procedure been authorized? Yes   5. Was MAC Script provided?  Yes   6. Reviewed Prep instructions with patient? Y/N? Yes   7. Scheduled procedure and physician's order match? Y/N? Yes   8. Informed patient to call back with any questions and provided number.Y/N? Yes     NOTE: If patients have any questions regarding prescribed medication patient needs to contact their prescribing physician to ensure it is ok to stop medications.      Comments/Notes:        If VM is left and pt calls back, please warm transfer patient to [phone]. This number is for internal use only and is not to be provided to patients.

## 2019-09-11 ENCOUNTER — Ambulatory Visit: Payer: BLUE CROSS/BLUE SHIELD

## 2019-09-11 ENCOUNTER — Telehealth: Payer: BLUE CROSS/BLUE SHIELD

## 2019-09-11 NOTE — Patient Instructions
Urine chlamydia and GC screen  I will call you or mychart message with results.   Please obtain immunization records. We can order titers if not available

## 2019-09-11 NOTE — Progress Notes
OUTPATIENT PROGRESS NOTE    PATIENT: Jonathan Weeks  MRN: [id number]  DOB: [birth year]  DATE OF SERVICE: 09/10/2019    PRIMARY CARE PROVIDER: Hoover Browns., MD    CHIEF COMPLAINT  Chief Complaint   Patient presents with   ? Annual Exam        HISTORY OF PRESENT ILLNESS   Jonathan Weeks is a 24 y.o. male here for form for [employer] school. Feels sore throat and testicular tenderness after recent partner. Concerned he may have STD. HAs had in the past      REVIEW OF SYSTEMS   (Check if unchanged, or note how changed)     [x]  Constitutional  [x]  Mental Status  [x]  Mood    [x]  Eyes  [x]  ENT  [x]  Cardiovascular    [x]  Resp  [x]  GI  [x]  GU    [x]  Neuro  [x]  Endo  [x]  Heme/Lymph    [x]  Musculoskeletal   [x]  Gait/balance  [x]  Cognition    [x]  Allergy/Immune  [x]  Skin       CHRONIC CONDITIONS  1.   []  Improved  []  Worsened  []  Stable   2.     []  Improved  []  Worsened  []  Stable   3.    []  Improved  []  Worsened  []  Stable     PAST MEDICAL HISTORY   Past Medical History:   Diagnosis Date   ? Occult blood in stools        ALLERGIES  No Known Allergies    MEDICATIONS  No outpatient medications have been marked as taking for the 09/10/19 encounter (Office Visit) with Hoover Browns., MD.           PHYSICAL EXAM (Check if Normal, or note positive findings)  BP 128/81  ~ Pulse 65  ~ Temp 36.4 ?C (97.5 ?F)  ~ Resp 18  ~ Ht 5' 10'' (1.778 m)  ~ Wt 197 lb 3.2 oz (89.4 kg)  ~ SpO2 98%  ~ BMI 28.30 kg/m?   Vitals:    09/10/19 0823   Weight: 197 lb 3.2 oz (89.4 kg)   Height: 5' 10'' (1.778 m)      Body mass index is 28.3 kg/m?Marland Kitchen    System Check if Normal Positive or additional negative findings   Constit  [x]  General appearance     Eyes  [x]  Conj/Lids []  Pupils  []  Fundi     ENMT  [x]  External ears/nose []  Otoscopy   []  Hearing []  Nasal mucosa   [x]  Lips/teeth/gums []  Oropharynx     Neck  [x]  Inspection/palpation []  Thyroid     Resp  [x]  Effort []  Percussion   [x]  Auscultation     CV  [x]  Auscultation []  Lower extremities []  Abd aorta []  Femoral  []  Pedal     Breast  [x]  Inspection []  Palpation     GI  [x]  Abd (masses or tenderness)   []  Liver/spleen []  Rectal     GU  M: []  Scrotum []  Penis []  Prostate   F:  []  External []  Bladder []  Cervix        []  Uterus []  Adnexa      Lymph  [x]  Neck []  Axillae []  Groin     MS  [x]  Gait []  Digits/nails  Specify site examined:    []  Inspect/palp []  ROM   []  Stability []  Strength/tone         Skin  [x]  Inspection []  Palpation  Neuro  [x]  CN2-12 [x]  DTRs  []  Sensation     Psych  [x]  Insight/judgement []  Orientation   [x]  Memory [x]  Mood/affect         LABS AND IMAGING     I have:   []  Reviewed/ordered []  1 []  2 [x]  ? 3 unique laboratory, radiology, and/or diagnostic tests noted below    []  Reviewed []  1 [x]  2 []  ? 3 prior external notes and incorporated into patient assessment    []  Discussed management or test interpretation with external provider(s) as noted       LABS:  Lab Results   Component Value Date    TSH 1.3 02/11/2015     Lab Results   Component Value Date    CHOL 174 10/18/2018    CHOL 151 02/11/2015    CHOLHDL 50 10/18/2018    CHOLHDL 51 02/11/2015    CHOLDLCAL 105 (H) 10/18/2018    CHOLDLCAL 88 02/11/2015    TRIGLY 95 10/18/2018    TRIGLY 62 02/11/2015     Lab Results   Component Value Date    HGBA1C 5.6 10/18/2018    HGBA1C 5.5 02/11/2015     Lab Results   Component Value Date    CREAT 1.06 05/22/2019    BUN 21 05/22/2019    NA 141 05/22/2019    K 5.0 05/22/2019    CL 103 05/22/2019    CO2 26 05/22/2019     Lab Results   Component Value Date    WBC 4.83 05/22/2019    HGB 15.2 05/22/2019    HGB 15.4 10/18/2018    HGB 15.4 02/11/2015    HCT 45.5 05/22/2019    HCT 46.6 10/18/2018    HCT 45.8 02/11/2015    MCV 94.8 05/22/2019    MCV 96.9 10/18/2018    MCV 93.1 02/11/2015    PLT 185 05/22/2019    PLT 199 10/18/2018     Lab Results   Component Value Date    ALT 35 05/22/2019    AST 27 05/22/2019    ALKPHOS 58 05/22/2019    BILITOT 0.4 05/22/2019     Lab Results   Component Value Date CALCIUM 9.5 05/22/2019     No results found for: VITD25OH  No results found for: INR, INRPOC, PT    IMAGING:  No imaging has been resulted in the last 365 days       A&P   Jonathan Weeks is a a 24 y.o. male presenting for:    ICD-10-CM    1. Screen for STD (sexually transmitted disease)  Z11.3 Chlamydia trachomatis/Neisseria gonorrhoeae PCR, Urine       Patient Instructions   Urine chlamydia and GC screen  I will call you or mychart message with results.   Please obtain immunization records. We can order titers if not available    Return for annual exam.     The above recommendation were discussed with the patient.  The patient has all questions answered satisfactorily and is in agreement with this recommended plan of care.  Leotis Pain, MD  Internal Medicine  Associate Clinical Professor of Medicine, [facility]  [address]  [place], [place] [address]

## 2019-09-11 NOTE — Telephone Encounter
Provided covid testing line number to patient [phone], advised needs to be done 1-2 days prior to scheduled procedure on 09/18/19. Informed we do not accept location he had scheduled outside of [place], advised there is a list of accepted places if not on list we will not accept.

## 2019-09-12 ENCOUNTER — Ambulatory Visit: Payer: BLUE CROSS/BLUE SHIELD

## 2019-09-12 LAB — Chlamydia trachomatis/Neisseria gonorrhoeae PCR: NEISSERIA GONORRHOEAE PCR: NEGATIVE

## 2019-09-12 MED ORDER — AZITHROMYCIN 250 MG PO TABS
ORAL_TABLET | 0 refills | Status: AC
Start: 2019-09-12 — End: ?

## 2019-09-13 NOTE — Telephone Encounter
pts chart was updated with ( PPD Test)

## 2019-09-14 ENCOUNTER — Ambulatory Visit: Payer: BLUE CROSS/BLUE SHIELD

## 2019-09-15 ENCOUNTER — Ambulatory Visit: Payer: BLUE CROSS/BLUE SHIELD

## 2019-09-16 ENCOUNTER — Ambulatory Visit: Payer: BLUE CROSS/BLUE SHIELD

## 2019-09-16 DIAGNOSIS — Z01812 Encounter for preprocedural laboratory examination: Secondary | ICD-10-CM

## 2019-09-17 ENCOUNTER — Ambulatory Visit: Payer: BLUE CROSS/BLUE SHIELD

## 2019-09-17 LAB — COVID-19 and Influenza A/B PCR: INFLUENZA A PCR: NOT DETECTED

## 2019-09-18 ENCOUNTER — Ambulatory Visit: Payer: BLUE CROSS/BLUE SHIELD

## 2019-09-18 ENCOUNTER — Telehealth: Payer: BLUE CROSS/BLUE SHIELD

## 2019-09-18 MED ADMIN — PROPOFOL 200 MG/20ML IV EMUL: @ 20:00:00 | Stop: 2019-09-18 | NDC 63323026929

## 2019-09-18 MED ADMIN — SODIUM CHLORIDE 0.9 % IV SOLN: INTRAVENOUS | @ 20:00:00 | Stop: 2019-09-18 | NDC 00338004904

## 2019-09-18 NOTE — Procedures
PATIENT NAME:       Jonathan Weeks, Jonathan Weeks  DATE OF BIRTH:       1996-07-13  RECORD NUMBER:      [id number]  DATE/TIME OF PROCEDURE:     09/18/2019 / 12:00:00 PM  ENDOSCOPIST:       Tedra Coupe, MD  REFERRING PHYSICIAN:        FELLOW:           INDICATIONS FOR EXAMINATION:      Rectal bleeding, diarrhea               PROCEDURE PERFORMED:             COLONOSCOPY    MEDICATIONS:    MAC Anesthesia    PROCEDURE TECHNIQUE:  Informed consent obtained for the procedure including risks, benefits and alternatives and the risk of those alternatives. Informed consent obtained for sedation including risks, benefits and alternatives and the risk of those alternatives. Pulse, pulse   oximetry, and blood pressure were monitored throughout the procedure. After normal digital rectal examination, the colonoscope was passed with ease through the anus under direct visualization; it was advanced to the terminal ileum. Cecum was confirmed by   the appendiceal orifice and ileocecal valve. The scope was withdrawn and the mucosa was carefully examined. The quality of the colonic preparation was good. The patient's toleration of the procedure was good. Retroflexion was performed in the rectum.    TOTAL WITHDRAWL TIME:  00:10:58  TOTAL INSERTION TIME: 00:17:54    EXTENT OF EXAM:  Terminal ileum            INSTRUMENTS:   [id number] #[id number] ([id number])  TECHNICAL DIFFICULTY:  No   LIMITATIONS:      None  TOLERANCE:   Good  VISUALIZATION:  Good    FINDINGS:   Normal perianal exam and DRE exam.   Terminal ileum was traversed for 10 cm. A few red pinpoint spots in the terminal ileum but was otherwise normal. No evidence of ileitis, erosions, or ulcerations. Normal villous pattern. Biopsies taken.   Normal colon mucosa. No evidence of colitis. Random cold forcep biopsies taken from cecum, ascending colon, transverse colon, descending colon, sigmoid colon, and rectum.  Diminutive internal hemorrhoids noted on retroflexion. The colonoscopy examination was otherwise normal.    ESTIMATED BLOOD LOSS:   None     DIAGNOSIS:  1. Normal terminal ileum and colon, biopsies taken.  2. Diminutive hemorrhoids noted.   3. Otherwise normal colonoscopy.    RECOMMENDATIONS:  1. Follow up on biopsy results.  2. Follow up in GI clinic with Dr. Peggye Ley          This electronic signature authenticates all electronic and/or handwritten documentation, including orders, generated by the signer during the episode of care contained in this record.  09/18/2019 01:06:41 PM By Tedra Coupe

## 2019-09-18 NOTE — Telephone Encounter
Forwarded by: Karle Barr de [place]    Placed all documents on your desk

## 2019-09-18 NOTE — H&P
Procedure: _0  EGD  _1  Colonoscopy  _2  PEG  _3  Enteroscopy  _4  Flexible Sigmoidoscopy                     _5  Other:    Level of sedation intended for procedure: _6  Moderate  _7  MAC  _8  Anesthesia    Informed Consent Obtained Including Risks, Benefits & Alternatives:  _9  Yes    Informed Consent Obtained For Sedation Including Risks, Benefits & Alternatives: _10  Yes    History:    Chief Complaint / Indication:  Diarrhea, rectal bleeding, rectal pain, positive celiac genetic testing    Allergies: No Known Allergies    Current Medication: Medication record reviewed  Medications that the patient states to be currently taking   Medication Sig    azithromycin 250 mg tablet Take 2 tablets (500 mg) on  Day 1,  followed by 1 tablet (250 mg) once daily on Days 2 through 5..       Past Medical History:   Past Medical History:   Diagnosis Date    Occult blood in stools         Family History: family history includes Breast cancer in his maternal aunt and maternal aunt; Colon cancer in his paternal aunt.    Social History:  reports that he has never smoked. He has never used smokeless tobacco.    Past Surgical History: No past surgical history on file.     Physical Exam:    Vitals Signs: There were no vitals filed for this visit.  There is no height or weight on file to calculate BMI.    Normal Exam                                     Additional Findings  _11  General  _12  HEENT  _13  Heart / CVS  _14  Lungs / Respiratory  _15  Abdomen      Airway Assessment:    Uvula Visualized: _16  Yes  _17  Partially  _18  Not at all  Neck ROM: _19  Normal  _20  Limited  Sleep apnea confirmed by sleep study or on CPAP at home: _21  No  _22  Yes      ASA Classification: ASA 1 - Normal health patient    I have reviewed the history and physical and have determined Jonathan Weeks to be an appropriate candidate to undergo the planned procedure with sedation and analgesia.

## 2019-09-18 NOTE — Procedures
PATIENT NAME:       Jonathan Weeks, Jonathan Weeks  DATE OF BIRTH:       1996-04-16  RECORD NUMBER:      [id number]  DATE/TIME OF PROCEDURE:     09/18/2019 / 12:00:00 PM  ENDOSCOPIST:       Tedra Coupe, MD  REFERRING PHYSICIAN:        FELLOW:           INDICATIONS FOR EXAMINATION:      Diarrhea, evaluate for celiac disease               PROCEDURE PERFORMED:             UPPER GI ENDOSCOPY    MEDICATIONS:    MAC anesthesia    PROCEDURE TECHNIQUE:  Informed consent obtained for the procedure including risks, benefits and alternatives and the risk of those alternatives. Informed consent obtained for sedation including risks, benefits and alternatives and the risk of those alternatives.    EKG, pulse, pulse oximetry and blood pressure were monitored throughout the procedure.     The endoscope was passed with ease through the mouth under direct visualization; it was extended to the 2nd portion of the duodenum. The scope was withdrawn and the mucosa was carefully examined. The views were excellent. The patient's toleration of the   procedure was excellent.    TOTAL WITHDRAWL TIME:    TOTAL INSERTION TIME: 00:07:00    EXTENT OF EXAM:  2nd portion of the duodenum            INSTRUMENTS:   [id number] #[id number] ([id number])  TECHNICAL DIFFICULTY:  No   LIMITATIONS:      None  TOLERANCE:   Good  VISUALIZATION:  Good    FINDINGS:   Esophagus: The esophagus appeared to be normal without evidence of esophagitis. The Z-line was at 38 cm. There was no hiatal hernia or Barrett's esophagus.   Stomach: The stomach appeared to be normal.  No evidence of ulcers, erosions. The pylorus was patent. On retroflexion no additional findings were noted.  Biopsies taken in antrum and mid body greater curvature to evaluate for H. pylori.    Duodenum: The duodenum appeared to be normal to the 2nd portion. Normal villous pattern. Biopsies taken in bulb and 2nd portion to evaluate for celiac.    ESTIMATED BLOOD LOSS:   None     DIAGNOSIS:  1. Normal esophagus. 2. Normal stomach. Biopsies taken in antrum and mid body greater curvature to evaluate for H. pylori.    3. Normal duodenum.  Biopsies taken in bulb and 2nd portion to evaluate for celiac.    RECOMMENDATIONS:  1. Follow up on biopsy results.   2. Follow up with Dr. Peggye Ley in the office          This electronic signature authenticates all electronic and/or handwritten documentation, including orders, generated by the signer during the episode of care contained in this record.  09/18/2019 01:00:54 PM By Tedra Coupe

## 2019-09-18 NOTE — H&P
UPDATED H&P REQUIREMENT    For [facility] and [facility]    WHAT IS THE STATUS OF THE PATIENT'S MOST CURRENT HISTORY AND PHYSICAL?   - The most current H&P was performed within the past 24 hours. No additional updated H&P documentation is necessary.     REFER TO MEDICAL STAFF POLICIES REGARDING PRE-PROCEDURE HISTORY AND PHYSICAL EXAMINATION AND UPDATED H&P REQUIREMENTS BELOW:     [facility] and [facility] Medical Staff Policy 200 - For Patients Undergoing Procedures Requiring Moderate or Deep Sedation, General Anesthesia or Regional Anesthesia    Contents of a History and Physical Examination (H&P):    The H&P shall consist of chief complaint, history of present illness, allergies and medications, relevant social and family history, past medical history, review of systems and physical examination, and assessment and plan appropriate to the patient's age.    For Patients Undergoing Procedures Requiring Moderate or Deep Sedation, General Anesthesia or Regional Anesthesia:    1. An H&P shall be performed within 24 hours prior to the procedure by a qualified member of the medical staff or designee with appropriate privileges, except as noted in item 2 below.    2. If a complete history and physical was performed within thirty (30) calendar days prior to the patient's admission to the Medical Center for elective surgery, a member of the medical staff assumes the responsibility for the accuracy of the clinical information and will need to document in the medical record within twenty-four (24) hours of admission and prior to surgery or major invasive procedure, that they either attest that the history and physical has been reviewed and accepted, or document an update of the original history and physical relevant to the patient's current clinical status.    3. Providing an H&P for  patients undergoing surgery under local anesthesia is at the discretion of the Attending Physician.     4. When a procedure is performed by a dentist, podiatrist or other practitioner who is not privileged to perform an H&P, the anesthesiologist's assessment immediately prior to the procedure will constitute the 24 hour re-assessment.The dentist, podiatrist or other practitioner who is not privileged to perform an H&P will document the history and physical relevant to the procedure.    5. If the H&P and the written informed consent for the surgery or procedure are not recorded in the patient's medical record prior to surgery, the operation shall not be performed unless the attending physician states in writing that such a delay could lead to an adverse event or irreversible damage to the patient.    6. The above requirements shall not preclude the rendering of emergency medical or surgical care to a patient in dire circumstances.

## 2019-09-18 NOTE — Telephone Encounter
Call Back Request    MD:  Dr. Melina Modena    Reason for call back: Patient is following up on medical forms e-mailed to Dr. Melina Modena, needed for college. Patient emailed on 09/14/2019 2 attachments and would like to confirm if Dr. Melina Modena received information. Patient needs forms completed by end of February. States cannot go to school unless has proof of vaccinations being up to date. Please follow up with patient to advise.     Any Symptoms:  []  Yes  [x]  No      ? If yes, what symptoms are you experiencing:    o Duration of symptoms (how long):    o Have you taken medication for symptoms (OTC or Rx):      Patient or caller has been notified of the 24-48 hour turnaround time.

## 2019-09-19 ENCOUNTER — Ambulatory Visit: Payer: BLUE CROSS/BLUE SHIELD

## 2019-09-19 NOTE — Telephone Encounter
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    Per Pt, he does not have proof of this and states the process will be very long to attempt to obtain the medical records from [facility].     Please advise.    Return call is not being requested by the patient or caller.    Patient or caller has been notified of the 24-48 hour processing turnaround time if applicable.

## 2019-09-20 ENCOUNTER — Ambulatory Visit: Payer: BLUE CROSS/BLUE SHIELD

## 2019-09-20 DIAGNOSIS — Z23 Encounter for immunization: Secondary | ICD-10-CM

## 2019-09-20 LAB — Tissue Exam

## 2019-09-23 ENCOUNTER — Telehealth: Payer: BLUE CROSS/BLUE SHIELD

## 2019-09-23 NOTE — Telephone Encounter
Called patient and went over biopsy results.   Pt doing well right now so will not schedule follow up.  He will reach out to me if any further issues.

## 2019-09-23 NOTE — Telephone Encounter
Hi Merit         Your paperwork is ready for pick up.    Byrd Hesselbach

## 2019-09-24 ENCOUNTER — Other Ambulatory Visit: Payer: Self-pay

## 2019-09-24 ENCOUNTER — Ambulatory Visit (INDEPENDENT_AMBULATORY_CARE_PROVIDER_SITE_OTHER): Payer: Self-pay | Admitting: Nurse Practitioner

## 2019-09-24 ENCOUNTER — Encounter: Payer: Self-pay | Admitting: Nurse Practitioner

## 2019-09-24 VITALS — BP 115/71 | HR 60 | Temp 98.2°F | Ht 70.5 in | Wt 200.8 lb

## 2019-09-24 DIAGNOSIS — F419 Anxiety disorder, unspecified: Secondary | ICD-10-CM

## 2019-09-24 MED ORDER — ARIPIPRAZOLE 5 MG PO TABS: 5.0000 mg | ORAL_TABLET | Freq: Every day | ORAL | 0 refills | Status: DC

## 2019-09-24 NOTE — Assessment & Plan Note (Signed)
Chronic, ongoing.  Although better on Abilify, PHQ-9 and GAD-7 still increased.  Will increase Abilify to 5mg  daily and f/u in 1 month.  Call or return to clinic in meantime with concerns.

## 2019-09-24 NOTE — Progress Notes (Signed)
BP 115/71 (BP Location: Left Arm, Patient Position: Sitting, Cuff Size: Normal)   Pulse 60   Temp 98.2 F (36.8 C) (Oral)   Ht 5' 10.5" (1.791 m)   Wt 200 lb 12.8 oz (91.1 kg)   SpO2 98%   BMI 28.40 kg/m    Subjective:    Patient ID: Jose Pena, male    DOB: 1996-06-03, 24 y.o.   MRN: [id number]  HPI: Jose Pena is a 24 y.o. male  Chief Complaint  Patient presents with  . Anxiety  . Medication Management    Patient would like to discuss going up on dose on Abilify.    STRESS/MOOD Duration:mood better than when first came in, trouble having controlling mood more Anxious mood: yes  Excessive worrying: no; medication helped with this Irritability: no ; medication has helped some Sweating: no Vomiting/Nausea: no Palpitations:no Hyperventilation: no Panic attacks: no Obscessions/compulsions: yes Depressed mood: no Depression screen [facility] 2/9 09/24/2019 07/10/2019  Decreased Interest 1 2  Down, Depressed, Hopeless 1 2  PHQ - 2 Score 2 4  Altered sleeping 3 3  Tired, decreased energy 3 1  Change in appetite 2 3  Feeling bad or failure about yourself  1 2  Trouble concentrating 0 0  Moving slowly or fidgety/restless 2 2  Suicidal thoughts 0 0  PHQ-9 Score 13 15  Difficult doing work/chores Very difficult -   GAD 7 : Generalized Anxiety Score 09/24/2019 07/10/2019  Nervous, Anxious, on Edge 3 3  Control/stop worrying 1 3  Worry too much - different things 1 3  Trouble relaxing 1 1  Restless 3 2  Easily annoyed or irritable 2 3  Afraid - awful might happen 0 2  Total GAD 7 Score 11 17  Anxiety Difficulty Very difficult -   Weight changes: yes; states "always flutuates" Insomnia: yes hard to fall asleep  Hypersomnia: no Fatigue/loss of energy: yes, especially last month Feelings of worthlessness: no Feelings of guilt: no Impaired concentration/indecisiveness: yes Suicidal ideations: no  Crying spells: no Recent Stressors/Life Changes: yes  Relationship problems: yes   Family stress: yes     Financial stress: yes    Job stress: yes    Recent death/loss: no He is requesting to increase the Abilify.  Allergies  Allergen Reactions  . Other Hives and Other (See Comments)    Possible Dye A shampoo he used once that gave him a rash   Outpatient Encounter Medications as of 09/24/2019  Medication Sig  . [DISCONTINUED] ARIPiprazole (ABILIFY) 2 MG tablet Take 1 tablet (2 mg total) by mouth daily.  . ARIPiprazole (ABILIFY) 5 MG tablet Take 1 tablet (5 mg total) by mouth daily.   No facility-administered encounter medications on file as of 09/24/2019.   Patient Active Problem List   Diagnosis Date Noted  . Anxiety 07/14/2019  . Hyperthyroidism 12/21/2016  . Malnutrition of mild degree (HCC) 12/21/2016  . Sleep deprivation 12/21/2016  . Urticaria 11/30/2015   Past Medical History:  Diagnosis Date  . Thyroid disease    Review of Systems  Constitutional: Positive for fatigue. Negative for diaphoresis and unexpected weight change.  Respiratory: Negative.  Negative for chest tightness and shortness of breath.   Cardiovascular: Negative.  Negative for palpitations.  Gastrointestinal: Negative.  Negative for nausea and vomiting.  Neurological: Negative.  Negative for dizziness, numbness and headaches.  Psychiatric/Behavioral: Positive for agitation, decreased concentration and sleep disturbance. Negative for confusion, dysphoric mood, hallucinations and suicidal ideas. The patient is  nervous/anxious.     Per HPI unless specifically indicated above     Objective:    BP 115/71 (BP Location: Left Arm, Patient Position: Sitting, Cuff Size: Normal)   Pulse 60   Temp 98.2 F (36.8 C) (Oral)   Ht 5' 10.5" (1.791 m)   Wt 200 lb 12.8 oz (91.1 kg)   SpO2 98%   BMI 28.40 kg/m   Wt Readings from Last 3 Encounters:  09/24/19 200 lb 12.8 oz (91.1 kg)  07/10/19 190 lb (86.2 kg)  03/06/18 199 lb 12.8 oz (90.6 kg)    Physical Exam  Vitals and nursing note reviewed.  Constitutional:      General: He is not in acute distress.    Appearance: Normal appearance. He is normal weight.  Eyes:     General: No scleral icterus.    Extraocular Movements: Extraocular movements intact.  Pulmonary:     Effort: Pulmonary effort is normal. No respiratory distress.  Abdominal:     General: Abdomen is flat. There is no distension.  Skin:    General: Skin is warm and dry.     Coloration: Skin is not jaundiced or pale.  Neurological:     General: No focal deficit present.     Mental Status: He is alert and oriented to person, place, and time. Mental status is at baseline.     Motor: No weakness.     Coordination: Coordination normal.     Gait: Gait normal.  Psychiatric:        Mood and Affect: Mood normal.        Behavior: Behavior normal.        Thought Content: Thought content normal.        Judgment: Judgment normal.    Results for orders placed or performed in visit on 07/10/19  TSH  Result Value Ref Range   TSH 0.360 (L) 0.450 - 4.500 uIU/mL      Assessment & Plan:   Problem List Items Addressed This Visit      Other   Anxiety - Primary    Chronic, ongoing.  Although better on Abilify, PHQ-9 and GAD-7 still increased.  Will increase Abilify to 5mg  daily and f/u in 1 month.  Call or return to clinic in meantime with concerns.          Follow up plan: Return in about 4 weeks (around 10/22/2019) for mood f/u.

## 2019-09-24 NOTE — Patient Instructions (Signed)

## 2019-09-30 ENCOUNTER — Inpatient Hospital Stay: Payer: BLUE CROSS/BLUE SHIELD

## 2019-09-30 ENCOUNTER — Ambulatory Visit: Payer: BLUE CROSS/BLUE SHIELD

## 2019-09-30 ENCOUNTER — Ambulatory Visit: Payer: Self-pay | Admitting: Family Medicine

## 2019-09-30 DIAGNOSIS — Z719 Counseling, unspecified: Secondary | ICD-10-CM

## 2019-09-30 DIAGNOSIS — K4090 Unilateral inguinal hernia, without obstruction or gangrene, not specified as recurrent: Secondary | ICD-10-CM

## 2019-09-30 DIAGNOSIS — R1032 Left lower quadrant pain: Secondary | ICD-10-CM

## 2019-09-30 DIAGNOSIS — N50812 Left testicular pain: Secondary | ICD-10-CM

## 2019-09-30 NOTE — Progress Notes
PATIENT: Jonathan Weeks   MRN:  [id number]  DOB:  26-Mar-1996  DATE OF SERVICE:  09/30/2019   PRIMARY CARE PROVIDER: Hoover Browns., MD  REFERING PROVIDER: No ref. provider found    Dear Dr. Bonnetta Barry ref. provider found :  I had the pleasure of seeing your patient for initial consultation in my Urology practice today for initial evaluation of,      ***  Chief Complaint:  Scrotal pain    History of Present Illness:  Jonathan Weeks (***) is a 24 y.o. man ***referred for scrotal pain. He complains of ***left testicular pain.  Denies ****hematuria, urethral discharge, or dysuria. The pain does not prevent him from activities of daily living. He has had *** sexual partners in the last 12 months (all male***). No previous groin or scrotal surgery, no history of scrotal trauma, no history of UTI or STDs.     Previous treatments:  Colonoscopy on 09/18/19 due to diarrhea     Location: left testes   Quality: pain described as a ***dull ache  Severity: bother score is *** on 1-10 scale  Duration: ***intermittent for *** weeks   Timing: each episode lasts *** minutes   Context: no identifiable cause***  Modifying Factors: relieved with ***     Patient Active Problem List   Diagnosis   ? Chronic diarrhea      No past surgical history on file.    Current Outpatient Medications   Medication Sig   ? azithromycin 250 mg tablet Take 2 tablets (500 mg) on  Day 1,  followed by 1 tablet (250 mg) once daily on Days 2 through 5..   ? hydrocortisone (ANUSOL-HC) 2.5% rectal cream Apply rectally 2 times daily. (Patient not taking: Reported on 08/30/2019.)   ? sodium sulfate-potassium sulfate-magnesium sulfate solution At 6pm the evening before colonoscopy, drink 16oz x1 dose, then drink 32oz water over 1 hour. At 5am the day of colonoscopy, repeat above. ? sodium sulfate-potassium sulfate-magnesium sulfate solution At 6pm the evening before colonoscopy, drink 16oz x1 dose, then drink 32oz water over 1 hour. At 5am the day of colonoscopy, repeat above.     No current facility-administered medications for this visit.       Allergies   Allergen Reactions   ? Gluten Meal    ? Wheat        Social History     Social History Narrative   ? Not on file        Family History:  ***No testicular cancer or prostate cancer    Review of Systems:  14 system review performed; all systems negative or non-contributory except as documented above in HPI.      Exam***          Labs:  Lab Results   Component Value Date    CREAT 1.06 05/22/2019    CREAT 1.07 10/18/2018    CREAT 1.1 02/11/2015     Lab Results   Component Value Date    HGBA1C 5.6 10/18/2018       Results for orders placed or performed in visit on 10/18/18   UA,Dipstick    Specimen: Clean Catch, Midstream; Urine   Result Value Ref Range    Urine Color Yellow      Specific Gravity 1.015 1.005 - 1.030    pH,Urine 7.0 5.0 - 8.0    Blood Negative Negative    Bilirubin Negative Negative    Ketones Negative Negative    Glucose Negative Negative  Protein Negative Negative    Leukocyte Esterase Negative Negative    Nitrite Negative Negative   UA,Microscopic    Specimen: Clean Catch, Midstream; Urine   Result Value Ref Range    RBC per uL 0 0 - 11 cells/uL    WBC per uL 1 0 - 22 cells/uL    RBC per HPF 0 0 - 2 cells/HPF    WBC per HPF 0 0 - 4 cells/HPF    Squamous Epi Cells 2 0 - 17 cells/uL   Urinalysis,Routine    Specimen: Clean Catch, Midstream; Urine    Narrative    The following orders were created for panel order Urinalysis,Routine.  Procedure                               Abnormality         Status                     ---------                               -----------         ------                     UA,Dipstick[[id number]]                  Normal              Final result UA,Microscopic[[id number]]               Normal              Final result                 Please view results for these tests on the individual orders.              Urinalysis***    Results for orders placed or performed in visit on 02/11/15   Chlamydia trachomatis PCR, Urine    Specimen: Clean Catch, Midstream; Urine   Result Value Ref Range    Specimen Type Urine     Chlamydia trachomatis PCR Negative Negative     Results for orders placed or performed in visit on 02/11/15   Neisseria gonorrhoeae PCR, Urine    Specimen: Clean Catch, Midstream; Urine   Result Value Ref Range    Specimen Type Urine     Neisseria gonorrhoeae PCR Negative Negative       Results for orders placed or performed in visit on 09/10/19   Chlamydia trachomatis/Neisseria gonorrhoeae PCR, Urine    Specimen: Clean Catch, Midstream; Urine   Result Value Ref Range    Chlamydia trachomatis PCR Negative Negative    Neisseria gonorrhoeae PCR Negative Negative    Specimen Type Urine          Imaging:  *** US Scrotum  ***    Procedure:   09/30/2019 post void residual ***cc           ***Imaging and labs reviewed and/or ordered. I independently visualized, reviewed, and interpreted the post void residual image/scan (see separate scanned image) and my personal interpretation is the urine residual is ***low and normal. ***CT scan images independently reviewed (see above). Patient is a self-referred 24 y.o male presenting for on and off testicular pain.     ***[facility]  Return to clinic ***.           Thank you for referring this patient to my urology practice. Please contact me with questions.    cc No ref. provider found    SCRIBE ATTESTATION:  I, Alexander Mt, have assisted Dr. Francoise Ceo, with the documentation for Jonathan Weeks.     Attending Signature: This note was prepared by my scribe. I have seen and examined the patient and discussed the plan of care. This note has been reviewed and edited by me to reflect my findings, assessment, and treatment plan as stated in this document.      Francoise Ceo, MD

## 2019-10-01 ENCOUNTER — Ambulatory Visit: Payer: BLUE CROSS/BLUE SHIELD

## 2019-10-01 LAB — UA,Microscopic: SQUAMOUS EPITHELIAL CELLS: 1 {cells}/uL (ref 0–17)

## 2019-10-01 MED ORDER — MELOXICAM 7.5 MG PO TABS
7.5 mg | ORAL_TABLET | Freq: Every day | ORAL | 0 refills | Status: AC
Start: 2019-10-01 — End: ?

## 2019-10-01 NOTE — Patient Instructions
Recommendations:  -Stretching.  -Meloxicam as needed.  -Scrotal support.  -Warm compresses.   -Avoid heavy lifting, straining, or any activities that cause groin pain.  -See hernia surgeon.  -Send me the scrotal ultrasound report.         Groin Strain (Adult)  A groin strain is a stretching or partial tearing of the muscle in the lower belly (abdomen) or upper thigh. This may happen because of too much coughing, heavy lifting, or active sports. The pain may last for several days or?weeks, depending on how bad the stretch or tear is. It will generally get better with rest, ice, and anti-inflammatory medicines.  A groin strain can lead to a groin hernia. This is also called an inguinal hernia. A hernia is a complete tear of the abdominal muscle. This allows fat or the intestines to bulge out and create a visible bump just above the thigh crease. This is a more serious problem and may need surgery to repair it. When you lie down, the bump should get smaller or disappear completely. If it doesn?t, and you are not able to flatten it with your hand, you need medical attention right away.  Home care  ? Don?t do any?heavy lifting, straining, or any activities that cause groin pain.  ? You may use over-the-counter pain medicine to control pain, unless another pain medicine was prescribed. If you have chronic liver or kidney disease or ever had a stomach ulcer or GI (gastrointestinal)?bleeding, talk with your healthcare provider before using these medicines.  Follow-up care  Follow up with your healthcare provider, or as advised. Make an appointment with your healthcare provider if you develop a bump in the area of the groin strain.  When to seek medical advice  Call your healthcare provider right away if any of these occur:  ? Increasing pain in the area of the groin strain  ? Tender bump just above the groin crease that does not flatten when you lie down or press on it  ? Overall abdominal swelling or pain ? Fever of 100.4?F (38?C) or above lasting for 24 to 48 hours  ? Chills  ? Repeated vomiting  ? Pain that moves to the lower right abdomen, just below the waistline, or spreads to the back  StayWell last reviewed this educational content on 12/30/2016  ? 2000-2020 [employer], [place]. All rights reserved. This information is not intended as a substitute for professional medical care. Always follow your healthcare professional's instructions.      Meloxicam Oral tablet  What is this medicine?  MELOXICAM (mel OX i cam) is a non-steroidal anti-inflammatory drug (NSAID). It is used to reduce swelling and to treat pain. It may be used for osteoarthritis, rheumatoid arthritis, or juvenile rheumatoid arthritis.  This medicine may be used for other purposes; ask your health care provider or pharmacist if you have questions.  What should I tell my health care provider before I take this medicine?  They need to know if you have any of these conditions:  ? asthma  ? cigarette smoker  ? coronary artery bypass graft (CABG) surgery within the past 2 weeks  ? drink more than 3 alcohol-containing drinks a day  ? heart disease or circulation problems such as heart failure or leg edema (fluid retention)  ? hemophilia or bleeding problems   ? high blood pressure  ? kidney disease  ? liver disease  ? stomach bleeding or ulcers  ? an unusual or allergic reaction to meloxicam,  aspirin, other NSAIDs, other medicines, foods, dyes, or preservatives  ? pregnant or trying to get pregnant  ? breast-feeding  How should I use this medicine?  Take this medicine by mouth with a full glass of water. Follow the directions on the prescription label. Take this medicine in an upright or sitting position. If possible take bedtime doses at least 10 minutes before lying down. You can take it with or without food. If it upsets your stomach, take it with food. Take your medicine at regular intervals. Do not take it more often than directed. A special MedGuide will be given to you by the pharmacist with each prescription and refill. Be sure to read this information carefully each time.  Talk to your pediatrician regarding the use of this medicine in children. Special care may be needed.  Elderly patients over 34 years old may have a stronger reaction to this medicine and need smaller doses.  Overdosage: If you think you have taken too much of this medicine contact a poison control center or emergency room at once.  NOTE: This medicine is only for you. Do not share this medicine with others.  What if I miss a dose?  If you miss a dose, take it as soon as you can. If it is almost time for your next dose, take only that dose. Do not take double or extra doses.  What may interact with this medicine?  ? alcohol  ? aspirin  ? cidofovir  ? diuretics  ? lithium  ? medicines for high blood pressure  ? methotrexate  ? other drugs for inflammation like ketorolac, ibuprofen, and prednisone  ? pemetrexed  ? warfarin  This list may not describe all possible interactions. Give your health care provider a list of all the medicines, herbs, non-prescription drugs, or dietary supplements you use. Also tell them if you smoke, drink alcohol, or use illegal drugs. Some items may interact with your medicine.  What should I watch for while using this medicine?  Tell your doctor or healthcare professional if your pain does not get better. Talk to your doctor before taking another medicine for pain. Do not treat yourself.  This medicine does not prevent heart attack or stroke. If you take aspirin to prevent heart attack or stroke, talk with your doctor or health care professional.  Do not take medicines such as ibuprofen and naproxen with this medicine. Side effects such as stomach upset, nausea, or ulcers may be more likely to occur. Many medicines available without a prescription should not be taken with this medicine. What side effects may I notice from receiving this medicine?  Side effects that you should report to your doctor or health care professional as soon as possible:  ? black or bloody stools, blood in the urine or vomit  ? blurred vision  ? chest pain  ? difficulty breathing or wheezing  ? nausea or vomiting  ? skin rash, skin redness, blistering or peeling skin, hives, or itching  ? slurred speech or weakness on one side of the body  ? swelling of eyelids, throat, lips   ? unexplained weight gain or swelling  ? unusually weak or tired  ? yellowing of eyes or skin  Side effects that usually do not require medical attention (report to your doctor or health care professional if they continue or are bothersome):  ? constipation or diarrhea  ? dizziness  ? gas or heartburn  ? stomach pain  This list may not describe  all possible side effects. Call your doctor for medical advice about side effects. You may report side effects to FDA at 1-800-FDA-1088.  Where should I keep my medicine?  Keep out of the reach of children.  Store at room temperature between 15 and 30 degrees C (59 and 86 degrees F). Protect from moisture. Keep container tightly closed. Throw away any unused medicine after the expiration date.  NOTE:This sheet is a summary. It may not cover all possible information. If you have questions about this medicine, talk to your doctor, pharmacist, or health care provider. Copyright? 2015 [employer]        Epididymitis?  Inflammation of the epididymis can cause pain and swelling in your scrotum. The epididymis is a small tube next to the testicle that stores sperm. Epididymitis is often caused by an infection. In sexually active men, it is often caused by a sexually transmitted infection (STI) such as chlamydia or gonorrhea. In boys and in men over 10, it can be from bacteria from other parts of the urinary tract (not an STI infection). Symptoms may begin with pain in the lower belly (abdomen) or low back. The pain then spreads down into the scrotum. Often only one side is affected. The testicle and scrotum swell and become very painful and red. You may have?fever and a burning when passing urine. Sometimes you may have?a discharge from the penis.  Treatment is with antibiotics, and anti-inflammatory and pain medicines. The condition should get better over the first few days of treatment. But it will take several weeks for all the swelling and mild pain to go away. If your healthcare provider thinks that an STI is the?cause, your sexual partners may need to be treated.  Home care  Here are some tips to help you care for yourself at home:  ? Support the scrotum. When lying down, place a rolled towel under the scrotum. When walking, use an athletic supporter or 2 pairs of jockey-style underwear.  ? To ease pain, put ice packs on the inflamed area. To make an?ice pack, put ice cubes in a plastic bag that seals at the top. Wrap the bag in a clean, thin?towel. Never put an ice pack directly on the skin.  ? Take pain medicine as directed. You may use?over-the-counter medicines?to control pain, unless another medicine was given. If you have long-term (chronic) liver or kidney disease, talk with your healthcare provider before taking these medicines. Also talk with your provider if you've ever had a stomach ulcer or GI (gastrointestinal) bleeding.  ? Get some rest.  Rest in bed for the first few days until the fever, pain, and swelling get better. It may take several weeks for all of the swelling to go away.  ? Prevent constipation. Constipation?can make you strain. This makes the pain worse. Prevent constipation?by eating natural laxatives. These include prunes, fresh fruits, and whole-grain cereals.?If needed, use a mild over-the-counter laxative?for constipation. Mineral oil can be used to keep the stools soft. ? Wait to have sex. Don't have sex until you have finished all treatment and all symptoms have cleared.  ? Take all medicine as directed. Don't miss any doses. And don't stop taking your medicine early, even if you feel better.  Follow-up care  Follow up with your healthcare provider, or as advised, to be sure you are responding correctly to treatment. If a culture was taken, you may call for the result as directed. A culture test can ensure that you are on the  correct antibiotic.?  When to seek medical advice  Call your healthcare provider right away?if any of these occur:  ? Fever of 100.4?F (38?C) or higher, or as directed by your provider  ? More pain or swelling of the testicle after starting treatment  ? Pressure or pain in your bladder that gets worse  ? Unable to pass urine for 8 hours  StayWell last reviewed this educational content on 04/01/2018  ? 2000-2020 [employer], [place]. All rights reserved. This information is not intended as a substitute for professional medical care. Always follow your healthcare professional's instructions.        Hernia (Adult)    A hernia can happen when there is a weakness or defect in the wall of the abdomen or groin.?Intestines or nearby tissues may move from their usual location and push through the weakness in the wall. This can cause a hernia (bulge) you may see or feel.  Causes and risk factors?  A hernia may be present at birth. Or it may be caused by the wear and tear of daily living. Certain factors can make a hernia more likely. These can include:  ? Heavy lifting  ? Straining, whether from lifting, movement, or constipation  ? Chronic cough  ? Injury to the abdominal wall  ? Excess weight  ? Pregnancy  ? Prior surgery  ? Older age  ? Family history of hernia  Symptoms  Symptoms of a hernia may come on suddenly. Or they may appear slowly over time. Some common symptoms include: ? Bulge in the groin area, around the navel, or in the scrotum (the bulge may get bigger when you stand and go away when you lie down)  ? Pain or pressure around the bulge  ? Pain during activities such as lifting, coughing, or sneezing  ? A feeling of weakness or pressure in the groin  ? Pain or swelling in the scrotum  Types of hernias  There are different types of hernia. The type you have depends on its location:  ? Inguinal. This type is in the groin or scrotum. It is more common in men. But, women can get this hernia, too.  ? Femoral. This type is in the groin, upper thigh (where the leg bends), or labia. It is more common in women.  ? Ventral. This type is in the abdominal wall.  ? Umbilical. This type occurs around the navel (belly button).  ? Incisional. This type occurs at the site of a previous surgery.  The condition of the hernia can help determine how urgently it needs to be treated.  ? Reducible. It goes back in by itself, or it can be pushed back in.  ? Irreducible. It can?t be pushed back in.  ? Incarcerated/strangulated. The intestine is trapped (incarcerated). If this happens, you won?t be able to push the bulge back in. If the incarcerated hernia isn?t treated, it may become strangulated. This means the area loses blood supply and the tissue may die.?This requires emergency surgery. You need treatment right away.  In most cases, a hernia will not heal on its own.You may need surgery to repair the defect in the abdominal wall or groin. You?ll be told more about surgery, if needed.  If your symptoms are not severe, treatment may sometimes be delayed. In such cases, you will need regular follow-up visits with the provider. You?ll be asked to keep track of your symptoms and to watch for signs of more serious problems. You may  also be given guidelines similar to the home care instructions below.  Home care  To help keep a hernia from getting worse, you may be advised to: ? Avoid heavy lifting and straining as directed.  ? Take steps to prevent constipation, such as eating more fiber and drinking more water. This may help reduce straining that can occur when having a bowel movement. Reducing straining may help keep your symptoms from getting worse.  ? Maintain a healthy weight or lose excess weight. This can help reduce strain on abdominal muscles and tissues.  ? Stop smoking. This can help prevent coughing that may also strain abdominal muscles and tissues.  Follow-up care  Follow up with your healthcare provider, or as directed.?If imaging tests were done, they will be reviewed a doctor. You will be told the results and any new findings that may affect your care.  When to seek medical advice  Call your healthcare provider right away if any of these occur:  ? Hernia hardens, swells, or grows larger  ? Hernia can no longer be pushed back in  ? Pain moves to the lower right abdomen (just below the waistline), or spreads to the back  Call 911  Call 911?if any of these occur:  ? Severe pain, redness, or tenderness in the area near the hernia  ? Pain worsens quickly and doesn?t get better  ? Inability to have a bowel movement or pass gas  ? Fever of 100.4?F (38?C) or higher, or as directed by your healthcare provider  StayWell last reviewed this educational content on 09/29/2016  ? 2000-2020 [employer], [place]. All rights reserved. This information is not intended as a substitute for professional medical care. Always follow your healthcare professional's instructions.

## 2019-10-02 LAB — Bacterial Culture Urine: BACTERIAL CULTURE URINE: NO GROWTH {titer}

## 2019-10-07 ENCOUNTER — Ambulatory Visit: Payer: BLUE CROSS/BLUE SHIELD

## 2019-10-07 NOTE — Consults
General Surgery Consult    Patient: Jonathan Weeks  MRN: [id number]  DOB: 1995-12-26  Date of Service: 10/07/2019    Requesting Physician: Laney Potash., MD, No admitting provider for patient encounter.    Reason for Consultation: ***    History of Present Illness: Jonathan Weeks is a 24 y.o. male with a history of rectal bleeding and diarrhea for which he underwent EGD and Colonoscopy 09/18/19 that was unremarkable, presenting for let inguinal hernia. He has not had episodes of bowel obstruction or incarceration.    Reducible?    A1c 5.6 in 09/2018, BMI 28.4,     US scrotum 09/2019: Full study in chart        Past Medical History:     Past Medical History:   Diagnosis Date   ? Occult blood in stools        Past Surgical History:     No past surgical history on file.    Scheduled Meds:     Current Outpatient Medications   Medication Sig   ? azithromycin 250 mg tablet Take 2 tablets (500 mg) on  Day 1,  followed by 1 tablet (250 mg) once daily on Days 2 through 5.. (Patient not taking: Reported on 09/30/2019.)   ? hydrocortisone (ANUSOL-HC) 2.5% rectal cream Apply rectally 2 times daily. (Patient not taking: Reported on 08/30/2019.)   ? meloxicam 7.5 mg tablet Take 1 tablet (7.5 mg total) by mouth daily with lunch.   ? sodium sulfate-potassium sulfate-magnesium sulfate solution At 6pm the evening before colonoscopy, drink 16oz x1 dose, then drink 32oz water over 1 hour. At 5am the day of colonoscopy, repeat above.   ? sodium sulfate-potassium sulfate-magnesium sulfate solution At 6pm the evening before colonoscopy, drink 16oz x1 dose, then drink 32oz water over 1 hour. At 5am the day of colonoscopy, repeat above.     No current facility-administered medications for this visit.        Allergies:     Gluten meal and Wheat    Family History:     Family History   Problem Relation Age of Onset   ? Colon cancer Paternal Aunt    ? Breast cancer Maternal Aunt    ? Breast cancer Maternal Aunt    ? Skin cancer Neg Hx        Social History:     Social History     Socioeconomic History   ? Marital status: Unknown     Spouse name: Not on file   ? Number of children: Not on file   ? Years of education: Not on file   ? Highest education level: Not on file   Occupational History   ? Not on file   Social Needs   ? Financial resource strain: Not on file   ? Food insecurity     Worry: Not on file     Inability: Not on file   ? Transportation needs     Medical: Not on file     Non-medical: Not on file   Tobacco Use   ? Smoking status: Never Smoker   ? Smokeless tobacco: Never Used   Substance and Sexual Activity   ? Alcohol use: Never     Alcohol/week: 0.0 oz     Frequency: Never   ? Drug use: Never   ? Sexual activity: Yes     Partners: Female   Lifestyle   ? Physical activity     Days per week: Not on  file     Minutes per session: Not on file   ? Stress: Not on file   Relationships   ? Social Wellsite geologist on phone: Not on file     Gets together: Not on file     Attends religious service: Not on file     Active member of club or organization: Not on file     Attends meetings of clubs or organizations: Not on file     Relationship status: Not on file   Other Topics Concern   ? Do you exercise at least a day, 3 or more days a week? Not Asked   ? Types of Exercise? (List in Comments) Not Asked   ? Do you follow a special diet? Not Asked   ? Vegan? Not Asked   ? Vegetarian? Not Asked   ? Pescatarian? Not Asked   ? Lactose Free? Not Asked   ? Gluten Free? Not Asked   ? Omnivore? Not Asked   Social History Narrative   ? Not on file       Review of Systems:     A 14 point review of systems was reviewed with the patient. In addition to the pertinent positives noted above, the remainder of the review of systems is negative.    Physical Exam:    There were no vitals filed for this visit.    There is no height or weight on file to calculate BMI. There were no vitals filed for this visit.  There were no vitals filed for this visit.    General: Well-appearing, no acute distress  Head: Normocephalic, atraumatic.   Eyes: There is no scleral icterus.   Nose: Patient is wearing a mask.  Abdomen: Abdomen is soft, non-distended, and non-tender to palpation. *** hernia. *** mm fascial defect at ***.  Groin: *** Small/Moderate/Large *** Right/Left hiatal hernia appreciated. No contralateral hernia appreciated. No scrotal involvement.  Extremities:  No clubbing, cyanosis, or edema.    Laboratory Review:  Lab Results   Component Value Date    WBC 4.83 05/22/2019    HGB 15.2 05/22/2019    HCT 45.5 05/22/2019    MCV 94.8 05/22/2019    PLT 185 05/22/2019       Imaging Review:   No imaging has been resulted in the last 30 days    Diagnosis:   1. ***    Assessment & Plan:  Mr. Jonathan Weeks is a 24 y.o. male patient who presents for surgical evaluation for *** hernia, *** reducible/irreducible.  I have discussed indication for repair with patient to prevent episode of incarceration or strangulation.  Hernia is *** a/symptomatic. I have advised the patient of potential risks of undergoing the operation including skin scarring, bleeding, infections, injury to bowel or bladder, seroma formation, infection of the mesh, possible future need to remove the mesh and recurrence of the hernia.  All the patient's questions that arose during the visit were answered to the apparent satisfaction of the patient.      *** Hernia repair, possible open    The above plan of care, diagnosis, orders, and follow-up were discussed with the patient and/or surrogate. Questions related to this recommended plan of care were answered. Return precautions given for all above conditions.      Follow-up: ***      The patient was discussed with attending physician Dr. ***, who agrees with the assessment and plan as above unless otherwise noted  in the addendum.      Author: Johney Frame, MD  Family Medicine, R2      I spent *** minutes face-to-face with the patient, care coordination, reviewing imaging, pertinent lab findings, and medical decision making. Over half in the discussion of the diagnosis and the discussed risks and benefits of surgery and alternative options.     ***

## 2019-10-21 ENCOUNTER — Ambulatory Visit: Payer: BLUE CROSS/BLUE SHIELD

## 2019-10-21 DIAGNOSIS — Z0000 Encounter for general adult medical examination without abnormal findings: Secondary | ICD-10-CM

## 2019-10-21 DIAGNOSIS — Z23 Encounter for immunization: Secondary | ICD-10-CM

## 2019-10-21 LAB — Differential Automated: ABSOLUTE LYMPHOCYTE COUNT: 2.74 10*3/uL (ref 1.30–3.40)

## 2019-10-21 LAB — Comprehensive Metabolic Panel
POTASSIUM: 4.5 mmol/L (ref 3.6–5.3)
TOTAL PROTEIN: 7.2 g/dL (ref 6.1–8.2)

## 2019-10-21 LAB — CBC: HEMOGLOBIN: 15.2 g/dL (ref 13.5–17.1)

## 2019-10-21 LAB — Lipid Panel: CHOLESTEROL,LDL,CALCULATED: 131 mg/dL — ABNORMAL HIGH (ref <100)

## 2019-10-21 NOTE — Progress Notes
PATIENT: Jonathan Weeks  MRN: [id number]  DOB: 1996-01-14  DATE OF SERVICE: 10/21/2019  CHIEF COMPLAINT:   Chief Complaint   Patient presents with   ? Annual Exam     HISTORY OF PRESENT ILLNESS   Jonathan Weeks is a 24 y.o. male who is here for physical exam and concerns about Annual Exam.    PAST MEDICAL AND SURGICAL HISTORY:   Past Medical History:   Diagnosis Date   ? Occult blood in stools       No past surgical history on file.  Patient Active Problem List   Diagnosis   (none) - all problems resolved or deleted           Family History   Problem Relation Age of Onset   ? Colon cancer Paternal Aunt    ? Breast cancer Maternal Aunt    ? Breast cancer Maternal Aunt    ? Skin cancer Neg Hx      SOC HISTORY:  Sleep 6-8 hours  Social History     Tobacco Use   ? Smoking status: Never Smoker   ? Smokeless tobacco: Never Used   Substance Use Topics   ? Alcohol use: Never     Alcohol/week: 0.0 oz     Frequency: Never   ? Drug use: Never     MEDICATIONS:  No outpatient medications have been marked as taking for the 10/21/19 encounter (Office Visit) with Hoover Browns., MD.     ALLERGIES:   Allergies   Allergen Reactions   ? Gluten Meal    ? Wheat      IMMUNIZATIONS:  Immunization History   Administered Date(s) Administered   ? DTaP 04/03/1997, 06/05/1997, 07/16/1997, 07/21/1998, 01/24/2001   ? HPV 9-valent vaccine IM (Gardasil 9) (PF) SYR/SDV (73-62 years of age boys) (68-4 years of age girls/women) 10/18/2018   ? Hepatitis A, unspecified formulation 12/22/2005, 12/06/2007   ? Hepatitis B, unspecified formulation 03/03/1997, 05/05/1997, 10/15/1997   ? Hib (PRP-T) 04/03/1997, 06/05/1997, 07/26/1997, 04/21/1998   ? IPV 04/03/1997, 06/05/1997, 07/02/1998, 01/24/2001   ? MMR 01/20/1998, 12/27/2001   ? PPD Test 12/22/2005, 12/30/2005   ? Rabies IM fibroblast culture (PCEC/RABAVERT) 10/23/2015, 11/18/2015, 11/23/2015   ? Td 05/07/2009   ? Tdap 10/23/2015   ? varicella vaccine (Varivax) 01/19/1999      REVIEW OF SYSTEMS: 14 system review performed; all systems negative except as documented above in HPI    PHYSICAL EXAM   BP 122/75  ~ Pulse 67  ~ Temp 36.3 ?C (97.3 ?F)  ~ Resp 18  ~ Ht 5' 10'' (1.778 m)  ~ Wt 204 lb 6.4 oz (92.7 kg)  ~ SpO2 98%  ~ BMI 29.33 kg/m?      BP Readings from Last 3 Encounters:   10/21/19 122/75   10/07/19 131/72   09/30/19 105/66      Wt Readings from Last 3 Encounters:   10/21/19 204 lb 6.4 oz (92.7 kg)   10/07/19 200 lb 9.6 oz (91 kg)   09/18/19 198 lb (89.8 kg)      Body mass index is 29.33 kg/m?Marland Kitchen    System Check if Normal Positive or additional negative findings   Constit  [x]  General appearance     Eyes  [x]  Conj/Lids [x]  Pupils  []  Fundi     ENMT  [x]  External ears/nose []  Otoscopy   []  Nasal mucosa   [x]  Lips/teeth/gums []  Oropharynx     Neck  [x]  Inspection/palpation []   Thyroid     Resp  [x]  Effort []  Percussion[x]  Auscultation     CV  [x]  Auscultation []  Lower extremities    []  Abd aorta []  Femoral  []  Pedal     Breast  [x]  Inspection []  Palpation     GI  [x]  Abd (masses or tenderness)   []  Liver/spleen []  Rectal     GU  M: []  Scrotum []  Penis []  Prostate   F:  []  External []  Bladder []  Cervix        []  Uterus []  Adnexa      Lymph  [x]  Neck []  Axillae []  Groin     MS  [x]  Gait []  Digits/nails  Specify site examined:    [x]  Inspect/palp [x]  ROM   [x]  Stability [x]  Strength/tone         Skin  [x]  Inspection [x]  Palpation     Neuro  [x]  CN2-12 [x]  DTRs  [x]  Sensation     Psych  [x]  Insight/judgement [x]  Orientation   [x]  Memory [x]  Mood/affect       LABS/STUDIES   I have:   []  Reviewed/ordered []  1 []  2 [x]  ? 3 unique laboratory, radiology, and/or diagnostic tests noted below    []  Reviewed []  1 [x]  2 []  ? 3 prior external notes and incorporated into patient assessment    []  Discussed management or test interpretation with external provider(s) as noted       LABS:  Lab Results   Component Value Date    TSH 1.3 02/11/2015     Lab Results   Component Value Date    CHOL 174 10/18/2018    CHOL 151 02/11/2015    CHOLHDL 50 10/18/2018    CHOLHDL 51 02/11/2015    CHOLDLCAL 105 (H) 10/18/2018    CHOLDLCAL 88 02/11/2015    TRIGLY 95 10/18/2018    TRIGLY 62 02/11/2015     Lab Results   Component Value Date    HGBA1C 5.6 10/18/2018    HGBA1C 5.5 02/11/2015     Lab Results   Component Value Date    CREAT 1.06 05/22/2019    BUN 21 05/22/2019    NA 141 05/22/2019    K 5.0 05/22/2019    CL 103 05/22/2019    CO2 26 05/22/2019     Lab Results   Component Value Date    WBC 4.83 05/22/2019    HGB 15.2 05/22/2019    HGB 15.4 10/18/2018    HGB 15.4 02/11/2015    HCT 45.5 05/22/2019    HCT 46.6 10/18/2018    HCT 45.8 02/11/2015    MCV 94.8 05/22/2019    MCV 96.9 10/18/2018    MCV 93.1 02/11/2015    PLT 185 05/22/2019    PLT 199 10/18/2018     Lab Results   Component Value Date    ALT 35 05/22/2019    AST 27 05/22/2019    ALKPHOS 58 05/22/2019    BILITOT 0.4 05/22/2019     Lab Results   Component Value Date    CALCIUM 9.5 05/22/2019     No results found for: VITD25OH  No results found for: INR, INRPOC, PT  IMAGING:  US scrotum image import comparison  This order is used to store images obtained from non [facility] imaging   facilities in PACS. This order is intended for comparison purposes only   and no formal result will be rendered.       Health Maintenance   Topic Date  Due   ? HPV Vaccines (2 - Male 3-dose series) 09/09/2020 (Originally 11/15/2018)   ? Tdap/Td Vaccine (3 - Td) 10/22/2025   ? Hepatitis C Screening  Completed   ? HIV Screening  Completed   ? Influenza Vaccine  Discontinued        A&P   Jonathan Weeks is a a 24 y.o. male presenting for:    ICD-10-CM    1. Preventative health care  Z00.00        There are no Patient Instructions on file for this visit.  No follow-ups on file.   The above recommendation were discussed with the patient.  The patient has all questions answered satisfactorily and is in agreement with this recommended plan of care.    Leotis Pain, MD  Internal Medicine  Associate Clinical Professor of Medicine, [facility]  [address]  [place], [place] [address]

## 2019-10-22 LAB — Measles Ab Immune Status: MEASLES AB IMMUNE STATUS: POSITIVE

## 2019-10-22 LAB — Mumps Ab Immune Status: MUMPS AB IMMUNE STATUS: POSITIVE

## 2019-10-22 LAB — Hgb A1c: HGB A1C - HPLC: 5.4 (ref <5.7)

## 2019-10-22 LAB — Rubella Ab Immune Status: RUBELLA AB IMMUNE STATUS: REACTIVE

## 2019-10-22 LAB — VZV Ab Immune Status: VZV AB IMMUNE STATUS: POSITIVE

## 2019-10-23 LAB — Chlamydia trachomatis/Neisseria gonorrhoeae PCR

## 2019-10-24 MED ORDER — MELOXICAM 7.5 MG PO TABS
7.5 mg | ORAL_TABLET | Freq: Every day | ORAL | 0 refills
Start: 2019-10-24 — End: ?

## 2019-10-25 ENCOUNTER — Telehealth: Payer: Self-pay | Admitting: Nurse Practitioner

## 2019-10-28 ENCOUNTER — Other Ambulatory Visit: Payer: Self-pay

## 2019-10-28 ENCOUNTER — Ambulatory Visit (INDEPENDENT_AMBULATORY_CARE_PROVIDER_SITE_OTHER): Payer: Self-pay | Admitting: Nurse Practitioner

## 2019-10-28 ENCOUNTER — Encounter: Payer: Self-pay | Admitting: Nurse Practitioner

## 2019-10-28 VITALS — Ht 71.0 in | Wt 200.0 lb

## 2019-10-28 DIAGNOSIS — F419 Anxiety disorder, unspecified: Secondary | ICD-10-CM

## 2019-10-28 MED ORDER — ARIPIPRAZOLE 5 MG PO TABS: 5.0000 mg | ORAL_TABLET | Freq: Every day | ORAL | 1 refills | Status: AC

## 2019-10-28 NOTE — Progress Notes (Signed)
Ht 5\' 11"  (1.803 m)   Wt 200 lb (90.7 kg)   BMI 27.89 kg/m    Subjective:    Patient ID: Jose Pena, male    DOB: [birth year], 24 y.o.   MRN: [id number]  HPI: CRIT OBREMSKI is a 24 y.o. male presenting for anxiety follow up.  Chief Complaint  Patient presents with  . Anxiety   MOOD/STRESS At the last visit, we increased his Abilify to 5mg  daily.  He states the Abilify is doing better, it just makes him really sleepy and he is taking more naps.  Duration:better Anxious mood: yes but thinks due to life stressors - moving, new job, starting school  Excessive worrying: no Irritability: no; states his agitation has gotten better Sweating: no Nausea: no Palpitations:no Hyperventilation: no Panic attacks: no Agoraphobia: no  Obscessions/compulsions: no Depressed mood: no Depression screen [facility] 2/9 10/28/2019 09/24/2019 07/10/2019  Decreased Interest 0 1 2  Down, Depressed, Hopeless 0 1 2  PHQ - 2 Score 0 2 4  Altered sleeping 2 3 3   Tired, decreased energy 2 3 1   Change in appetite 0 2 3  Feeling bad or failure about yourself  0 1 2  Trouble concentrating 2 0 0  Moving slowly or fidgety/restless 1 2 2   Suicidal thoughts 0 0 0  PHQ-9 Score 7 13 15   Difficult doing work/chores Somewhat difficult Very difficult -   GAD 7 : Generalized Anxiety Score 10/28/2019 09/24/2019 07/10/2019  Nervous, Anxious, on Edge 2 3 3   Control/stop worrying 2 1 3   Worry too much - different things 2 1 3   Trouble relaxing 2 1 1   Restless 0 3 2  Easily annoyed or irritable 2 2 3   Afraid - awful might happen 0 0 2  Total GAD 7 Score 10 11 17   Anxiety Difficulty Somewhat difficult Very difficult -   Anhedonia: no Weight changes: no Insomnia: yes hard to fall asleep ; thinks due to taking naps during day Hypersomnia: yes; napping Fatigue/loss of energy: no Feelings of worthlessness: no Feelings of guilt: no Impaired concentration/indecisiveness: yes; "bad attention  span" Suicidal ideations: no  Crying spells: no Recent Stressors/Life Changes: yes   Relationship problems: yes but improving   Family stress: no     Financial stress: yes    Job stress: yes    Recent death/loss: no  Allergies  Allergen Reactions  . Other Hives and Other (See Comments)    Possible Dye A shampoo he used once that gave him a rash   Outpatient Encounter Medications as of 10/28/2019  Medication Sig  . ARIPiprazole (ABILIFY) 5 MG tablet Take 1 tablet (5 mg total) by mouth daily.  . [DISCONTINUED] ARIPiprazole (ABILIFY) 5 MG tablet Take 1 tablet (5 mg total) by mouth daily.   No facility-administered encounter medications on file as of 10/28/2019.   Patient Active Problem List   Diagnosis Date Noted  . Anxiety 07/14/2019  . Hyperthyroidism 12/21/2016   Past Medical History:  Diagnosis Date  . Malnutrition of mild degree ([place]) 12/21/2016  . Sleep deprivation 12/21/2016  . Thyroid disease   . Urticaria 11/30/2015   Relevant past medical, surgical, family and social history reviewed and updated as indicated. Interim medical history since our last visit reviewed.  Review of Systems  Constitutional: Positive for fatigue. Negative for activity change, appetite change and fever.  Respiratory: Negative.  Negative for cough and shortness of breath.   Cardiovascular: Negative.  Negative for chest pain and palpitations.  Gastrointestinal: Negative.  Negative for nausea and vomiting.  Neurological: Negative.  Negative for dizziness, speech difficulty, weakness, light-headedness and headaches.  Psychiatric/Behavioral: Positive for sleep disturbance. Negative for agitation, confusion and decreased concentration. The patient is not nervous/anxious.    Per HPI unless specifically indicated above     Objective:    Ht 5\' 11"  (1.803 m)   Wt 200 lb (90.7 kg)   BMI 27.89 kg/m   Wt Readings from Last 3 Encounters:  10/28/19 200 lb (90.7 kg)  09/24/19 200 lb 12.8 oz (91.1 kg)   07/10/19 190 lb (86.2 kg)    Physical Exam  Physical Exam unable to be performed due to lack of equipment.     Assessment & Plan:   Problem List Items Addressed This Visit      Other   Anxiety - Primary    Chronic, ongoing.  Much better on Abilify 5mg  per patient although PHQ-9 and GAD-7 still elevated.  Patient attributes this to recent stressors of new job, applying for school, and moving.  Does not desire dose increase at this time, will send in refill for Abilify 5mg  to pharmacy.  Advised to take Abilify closer to bedtime to prevent mid-day drowsiness.  Will follow up in 6 months.  Advised to call or return to clinic with any new concerns or any mood changes in the meantime.       Relevant Medications   ARIPiprazole (ABILIFY) 5 MG tablet       Follow up plan: Return in about 6 months (around 04/29/2020) for mood f/u.  This visit was completed via telephone due to the restrictions of the COVID-19 pandemic. All issues as above were discussed and addressed but no physical exam was performed. If it was felt that the patient should be evaluated in the office, they were directed there. The patient verbally consented to this visit. Patient was unable to complete an audio/visual visit due to Lack of equipment. . Location of the patient: work . Location of the provider: work . Those involved with this call:  . Provider: , DNP . CMA: , CMA . Front Desk/Registration: [facility]  . Time spent on call: 7 minutes on the phone discussing health concerns. 15 minutes total spent in review of patient's record and preparation of their chart.  I verified patient identity using two factors (patient name and date of birth). Patient consents verbally to being seen via telemedicine visit today.

## 2019-10-28 NOTE — Assessment & Plan Note (Signed)
Chronic, ongoing.  Much better on Abilify 5mg  per patient although PHQ-9 and GAD-7 still elevated.  Patient attributes this to recent stressors of new job, applying for school, and moving.  Does not desire dose increase at this time, will send in refill for Abilify 5mg  to pharmacy.  Advised to take Abilify closer to bedtime to prevent mid-day drowsiness.  Will follow up in 6 months.  Advised to call or return to clinic with any new concerns or any mood changes in the meantime.

## 2019-10-28 NOTE — Patient Instructions (Signed)
Managing Stress, Adult Feeling a certain amount of stress is normal. Stress helps our body and mind get ready to deal with the demands of life. Stress hormones can motivate you to do well at work and meet your responsibilities. However severe or long-lasting (chronic) stress can affect your mental and physical health. Chronic stress puts you at higher risk for anxiety, depression, and other health problems like digestive problems, muscle aches, heart disease, high blood pressure, and stroke. What are the causes? Common causes of stress include:  Demands from work, such as deadlines, feeling overworked, or having long hours.  Pressures at home, such as money issues, disagreements with a spouse, or parenting issues.  Pressures from major life changes, such as divorce, moving, loss of a loved one, or chronic illness. You may be at higher risk for stress-related problems if you do not get enough sleep, are in poor health, do not have emotional support, or have a mental health disorder like anxiety or depression. How to recognize stress Stress can make you:  Have trouble sleeping.  Feel sad, anxious, irritable, or overwhelmed.  Lose your appetite.  Overeat or want to eat unhealthy foods.  Want to use drugs or alcohol. Stress can also cause physical symptoms, such as:  Sore, tense muscles, especially in the shoulders and neck.  Headaches.  Trouble breathing.  A faster heart rate.  Stomach pain, nausea, or vomiting.  Diarrhea or constipation.  Trouble concentrating. Follow these instructions at home: Lifestyle  Identify the source of your stress and your reaction to it. See a therapist who can help you change your reactions.  When there are stressful events: ? Talk about it with family, friends, or co-workers. ? Try to think realistically about stressful events and not ignore them or overreact. ? Try to find the positives in a stressful situation and not focus on the  negatives. ? Cut back on responsibilities at work and home, if possible. Ask for help from friends or family members if you need it.  Find ways to cope with stress, such as: ? Meditation. ? Deep breathing. ? Yoga or tai chi. ? Progressive muscle relaxation. ? Doing art, playing music, or reading. ? Making time for fun activities. ? Spending time with family and friends.  Get support from family, friends, or spiritual resources. Eating and drinking  Eat a healthy diet. This includes: ? Eating foods that are high in fiber, such as beans, whole grains, and fresh fruits and vegetables. ? Limiting foods that are high in fat and processed sugars, such as fried and sweet foods.  Do not skip meals or overeat.  Drink enough fluid to keep your urine pale yellow. Alcohol use  Do not drink alcohol if: ? Your health care provider tells you not to drink. ? You are pregnant, may be pregnant, or are planning to become pregnant.  Drinking alcohol is a way some people try to ease their stress. This can be dangerous, so if you drink alcohol: ? Limit how much you use to:  0-1 drink a day for women.  0-2 drinks a day for men. ? Be aware of how much alcohol is in your drink. In the [place] one drink equals one 12 oz bottle of beer (355 mL), one 5 oz glass of wine (148 mL), or one 1 oz glass of hard liquor (44 mL). Activity   Include 30 minutes of exercise in your daily schedule. Exercise is a good stress reducer.  Include time in your day   for an activity that you find relaxing. Try taking a walk, going on a bike ride, reading a book, or listening to music.  Schedule your time in a way that lowers stress, and keep a consistent schedule. Prioritize what is most important to get done. General instructions  Get enough sleep. Try to go to sleep and get up at about the same time every day.  Take over-the-counter and prescription medicines only as told by your health care provider.  Do not use any  products that contain nicotine or tobacco, such as cigarettes, e-cigarettes, and chewing tobacco. If you need help quitting, ask your health care provider.  Do not use drugs or smoke to cope with stress.  Keep all follow-up visits as told by your health care provider. This is important. Where to find support  Talk with your health care provider about stress management or finding a support group.  Find a therapist to work with you on your stress management techniques. Contact a health care provider if:  Your stress symptoms get worse.  You are unable to manage your stress at home.  You are struggling to stop using drugs or alcohol. Get help right away if:  You may be a danger to yourself or others.  You have any thoughts of death or suicide. If you ever feel like you may hurt yourself or others, or have thoughts about taking your own life, get help right away. You can go to your nearest emergency department or call:  Your local emergency services (911 in the [place]  A suicide crisis helpline, such as the [place] at 786-499-0900. This is open 24 hours a day. Summary  Feeling a certain amount of stress is normal, but severe or long-lasting (chronic) stress can affect your mental and physical health.  Chronic stress can put you at higher risk for anxiety, depression, and other health problems like digestive problems, muscle aches, heart disease, high blood pressure, and stroke.  You may be at higher risk for stress-related problems if you do not get enough sleep, are in poor health, lack emotional support, or have a mental health disorder like anxiety or depression.  Identify the source of your stress and your reaction to it. Try talking about stressful events with family, friends, or co-workers, finding a coping method, or getting support from spiritual resources.  If you need more help, talk with your health care provider about finding a support group  or a mental health therapist. This information is not intended to replace advice given to you by your health care provider. Make sure you discuss any questions you have with your health care provider. Document Revised: 02/13/2019 Document Reviewed: 02/13/2019 Elsevier Patient Education  [place].

## 2019-10-30 ENCOUNTER — Ambulatory Visit: Payer: BLUE CROSS/BLUE SHIELD

## 2019-10-30 DIAGNOSIS — Z23 Encounter for immunization: Secondary | ICD-10-CM

## 2019-10-31 ENCOUNTER — Telehealth: Payer: BLUE CROSS/BLUE SHIELD

## 2019-10-31 ENCOUNTER — Ambulatory Visit: Payer: BLUE CROSS/BLUE SHIELD

## 2019-10-31 DIAGNOSIS — Z113 Encounter for screening for infections with a predominantly sexual mode of transmission: Secondary | ICD-10-CM

## 2019-10-31 NOTE — Telephone Encounter
Orders Request    What is being requested? (Tests, Labs, Imaging, etc.): STD panel     Reason for the request: sore throat, white spots, testicular pain    Where does the patient want to be seen?    [place]   If outside [place], what is the fax number to the facility?      Has the patient seen their doctor for this matter?   yes  Last office visit: 10/21/19    Patient was offered an appointment but declined.    Patient has been notified of the 24-48 hour turnaround time.      yes

## 2019-11-06 ENCOUNTER — Ambulatory Visit: Payer: BLUE CROSS/BLUE SHIELD

## 2019-11-06 DIAGNOSIS — N50812 Left testicular pain: Secondary | ICD-10-CM

## 2019-11-06 DIAGNOSIS — N50811 Right testicular pain: Secondary | ICD-10-CM

## 2019-11-06 NOTE — Progress Notes
PATIENT: Jonathan Weeks  MRN: [id number]  DOB: 1995-10-04  DATE OF SERVICE: 11/06/2019    CHIEF COMPLAINT:   Chief Complaint   Patient presents with   ? Follow-up     STD screening         HPI   Jonathan Weeks is a 24 y.o. male presents for   Chief Complaint   Patient presents with   ? Follow-up     STD screening      Pt is convinced he has some kind of infection in the genital area. He was wondering if he can get empirically treated for STD.     Symptoms include bilateral testicular discomfort, sometimes burning in urine, and intermittent white spots in throat. Denies blisters/burning rash in genital. He believes he can control these symptoms with his diet. When he eats normally, he has more symptoms because he thinks the food can feed the bacteria/infection in the genitals and make them worse. He's been eating more clean recently, with improvement in symptoms. He's afraid that eating clean may give him false negative results for STD tests.     Sexually active with male. Last time sexually active was about a month ago.     Evaluated by urology for above symptoms.     S/p azithromycin 09/18/2019 and doxycycline x 7 days and IM antibiotic in 09/2019. Pt had sex again after treatment.     Prob List   There are no active problems to display for this patient.    PMH     Past Medical History:   Diagnosis Date   ? Occult blood in stools      PSxH   No past surgical history on file.  ALL     Allergies   Allergen Reactions   ? Gluten Meal    ? Wheat      [facility] AND [facility]     Family History   Problem Relation Age of Onset   ? Colon cancer Paternal Aunt    ? Breast cancer Maternal Aunt    ? Breast cancer Maternal Aunt    ? Skin cancer Neg Hx      Social History     Tobacco Use   ? Smoking status: Never Smoker   ? Smokeless tobacco: Never Used   Substance Use Topics   ? Alcohol use: Never     Alcohol/week: 0.0 oz     Frequency: Never       MEDS     Medications that the patient states to be currently taking   Medication Sig   ? meloxicam 7.5 mg tablet Take 1 tablet (7.5 mg total) by mouth daily with lunch.   ? sodium sulfate-potassium sulfate-magnesium sulfate solution At 6pm the evening before colonoscopy, drink 16oz x1 dose, then drink 32oz water over 1 hour. At 5am the day of colonoscopy, repeat above.   ? sodium sulfate-potassium sulfate-magnesium sulfate solution At 6pm the evening before colonoscopy, drink 16oz x1 dose, then drink 32oz water over 1 hour. At 5am the day of colonoscopy, repeat above.       ROS   []  10-point ROS reviewed: normal unless stated in HPI            []  reviewed questionnaire on 11/06/2019 (updated in chart)    PHYSICAL EXAM      Last Recorded Vital Signs:    11/06/19 0852   BP: 134/80   Pulse: 70   Temp: 37 ?C (98.6 ?F)   SpO2:  96%     Body mass index is 28.84 kg/m?Marland Kitchen    System Check if normal Positive or additional negative findings   Constit  [x]  General appearance     Eyes  []  Conj/Lids []  Pupils  []  Fundi     HENMT  []  External ears/nose []  Otoscopy   [x]  Gross Hearing []  Nasal mucosa   []  Lips/teeth/gums []  Oropharynx    []  mucus membranes []  Head     Neck  []  Inspection/palpation []  Thyroid     Resp  [x]  Effort []  Wheezing    []  Auscultation  []  Crackles     CV  []  Rhythm/rate   []  Murmurs   []  LEE   []  JVP non-elevated    Normal pulses:   []  Radial []  Femoral  []  Pedal     Breast  []  Inspection []  Palpation     GI  []  abd masses    []  tenderness   []  rebound/guarding   []  Liver/spleen []  Rectal     GU  M: []  Scrotum []  Penis []  Prostate   F:  []  External []  vaginal wall        []  Cervix  []  mucus        []  Uterus    []  Adnexa      Lymph  []  Neck []  Axillae []  Groin     MSK Specify site examined:    []  Inspect/palp []  ROM   []  Stability []  Strength/tone         Skin  []  Inspection []  Palpation     Neuro  []  CN2-12 intact grossly   [x]  Alert and oriented   []  DTR      []  Muscle strength      []  Sensation   []  Gait/balance     Psych  [x]  Insight/judgement     [x]  Mood/affect    [x]  Gross cognition LABS/STUDIES   I have:   [x]  Reviewed/ordered []  1 []  2 [x]  ? 3 unique laboratory, radiology, and/or diagnostic tests noted below    []  Reviewed []  1 []  2 []  ? 3 prior external notes and incorporated into patient assessment    []  Discussed management or test interpretation with external provider(s) as noted       Lab Studies:  None     Imaging Studies:   None    A&P   Jonathan Weeks is a a 24 y.o. male presenting for   Chief Complaint   Patient presents with   ? Follow-up     STD screening          ASSESSMENT  Diagnoses and all orders for this visit:    Pain in both testicles/intermittent white spots in throat: Pt is convinced he has some kind of STD or genital infection. S/p treatment with azithromycin 09/2019 and doxycycline x 7 days and IM antibiotic 09/2019. Chlam[facility]/gonorrhea tests at [facility] have been negative. I advised him that diet will not have any impact on STD/genital infections and will not affect the test results in anyway. I advised against empiric antibiotic without any confirmatory test results.   -     HIV-1/2 Ag/Ab 4th Generation with Reflex Confirmation; Future  -     RPR; Future  -     HBS Antigen; Future  -     HCV Antibody Screen; Future  -     Chlamydia trachomatis/Neisseria gonorrhoeae PCR, Urine; Future  -     Urinalysis w/Reflex to Culture; Future  -  Mycoplasma/Ureaplasma Culture, Urine; Future  - Pt still believes eating a clean diet may give him false negative test results, so he may return after eating normally for ''accurate test results''.     The above recommendation were discussed with the patient.  The patient has all questions answered satisfactorily and is in agreement with this recommended plan of care.    No follow-ups on file.     Author:  Don Broach 11/06/2019 9:09 AM

## 2019-11-07 LAB — UA,Dipstick: NITRITE: NEGATIVE (ref 5.0–8.0)

## 2019-11-07 LAB — RPR: RPR: NONREACTIVE

## 2019-11-07 LAB — HCV Ab Screen: HCV ANTIBODY SCREEN: NONREACTIVE

## 2019-11-07 LAB — Ureaplasma/Genital Mycoplasma Culture: MYCOPLASMA/UREAPLASMA CULTURE: NEGATIVE

## 2019-11-07 LAB — UA,Microscopic: WBCS HPF: 1 {cells}/[HPF] (ref 0–4)

## 2019-11-07 LAB — HIV-1/2 Ag/Ab 4th Generation with Reflex Confirmation: HIV-1/2 AG/AB 4TH GENERATION WITH REFLEX CONFIRMATION: NONREACTIVE

## 2019-11-07 LAB — HBs Ag: HEPATITIS B SURFACE ANTIGEN: NONREACTIVE

## 2019-11-08 LAB — Chlamydia trachomatis/Neisseria gonorrhoeae PCR

## 2019-11-11 ENCOUNTER — Ambulatory Visit: Payer: BLUE CROSS/BLUE SHIELD

## 2019-11-11 DIAGNOSIS — R5383 Other fatigue: Secondary | ICD-10-CM

## 2019-11-11 DIAGNOSIS — Z113 Encounter for screening for infections with a predominantly sexual mode of transmission: Secondary | ICD-10-CM

## 2019-11-11 NOTE — Progress Notes
PATIENT: Jonathan Weeks  MRN: [id number]  DOB: 1996-04-14  DATE OF SERVICE: 11/11/2019    CHIEF COMPLAINT:   Chief Complaint   Patient presents with   ? std check     STD and testosterone check        HPI   Jonathan Weeks is a 24 y.o. male presents for   Chief Complaint   Patient presents with   ? std check     STD and testosterone check     Pt wants testosterone level checked. No ED symptoms. + fatigue.     Pt has been eating more normal (more meat) since last visit. Wants to get chlamydia/gonrorhea checked again. He just wants to make sure there is no infection that can be causing pain in both testicles.     Prob List   There are no active problems to display for this patient.    PMH     Past Medical History:   Diagnosis Date   ? Occult blood in stools      PSxH   No past surgical history on file.  ALL     Allergies   Allergen Reactions   ? Gluten Meal    ? Wheat      [facility] AND [facility]     Family History   Problem Relation Age of Onset   ? Colon cancer Paternal Aunt    ? Breast cancer Maternal Aunt    ? Breast cancer Maternal Aunt    ? Skin cancer Neg Hx      Social History     Tobacco Use   ? Smoking status: Never Smoker   ? Smokeless tobacco: Never Used   Substance Use Topics   ? Alcohol use: Never     Alcohol/week: 0.0 oz     Frequency: Never       MEDS     Medications that the patient states to be currently taking   Medication Sig   ? meloxicam 7.5 mg tablet Take 1 tablet (7.5 mg total) by mouth daily with lunch.   ? sodium sulfate-potassium sulfate-magnesium sulfate solution At 6pm the evening before colonoscopy, drink 16oz x1 dose, then drink 32oz water over 1 hour. At 5am the day of colonoscopy, repeat above.   ? sodium sulfate-potassium sulfate-magnesium sulfate solution At 6pm the evening before colonoscopy, drink 16oz x1 dose, then drink 32oz water over 1 hour. At 5am the day of colonoscopy, repeat above.       ROS   []  10-point ROS reviewed: normal unless stated in HPI            []  reviewed questionnaire on 11/11/2019 (updated in chart)    PHYSICAL EXAM      Last Recorded Vital Signs:    11/11/19 0812   BP: 124/73   Pulse: 59   Temp: 36.1 ?C (96.9 ?F)   SpO2: 97%     Body mass index is 29.67 kg/m?Jonathan Weeks    System Check if normal Positive or additional negative findings   Constit  [x]  General appearance     Eyes  []  Conj/Lids []  Pupils  []  Fundi     HENMT  []  External ears/nose []  Otoscopy   [x]  Gross Hearing []  Nasal mucosa   []  Lips/teeth/gums []  Oropharynx    []  mucus membranes []  Head     Neck  []  Inspection/palpation []  Thyroid     Resp  [x]  Effort []  Wheezing    []  Auscultation  []   Crackles     CV  []  Rhythm/rate   []  Murmurs   []  LEE   []  JVP non-elevated    Normal pulses:   []  Radial []  Femoral  []  Pedal     Breast  []  Inspection []  Palpation     GI  []  abd masses    []  tenderness   []  rebound/guarding   []  Liver/spleen []  Rectal     GU  M: []  Scrotum []  Penis []  Prostate   F:  []  External []  vaginal wall        []  Cervix  []  mucus        []  Uterus    []  Adnexa      Lymph  []  Neck []  Axillae []  Groin     MSK Specify site examined:    []  Inspect/palp []  ROM   []  Stability []  Strength/tone         Skin  []  Inspection []  Palpation     Neuro  []  CN2-12 intact grossly   [x]  Alert and oriented   []  DTR      []  Muscle strength      []  Sensation   []  Gait/balance     Psych  [x]  Insight/judgement     [x]  Mood/affect    [x]  Gross cognition        LABS/STUDIES   I have:   [x]  Reviewed/ordered []  1 []  2 [x]  ? 3 unique laboratory, radiology, and/or diagnostic tests noted below    []  Reviewed []  1 []  2 []  ? 3 prior external notes and incorporated into patient assessment    []  Discussed management or test interpretation with external provider(s) as noted       Lab Studies:  Last UA:   Lab Results   Component Value Date/Time    PHUR 8.0 11/06/2019 09:14 AM    SPECGRAVUR 1.022 11/06/2019 09:14 AM    BLDUR Negative 11/06/2019 09:14 AM    GLUCOSEUR Negative 11/06/2019 09:14 AM    KETONESUR Negative 11/06/2019 09:14 AM LEUKESTUR Negative 11/06/2019 09:14 AM    NITRITEUR Negative 11/06/2019 09:14 AM    RBCSUR 4 11/06/2019 09:14 AM    WBCSUR 3 11/06/2019 09:14 AM        Imaging Studies:   None    A&P   Martese A Bail is a a 24 y.o. male presenting for   Chief Complaint   Patient presents with   ? std check     STD and testosterone check         ASSESSMENT  Diagnoses and all orders for this visit:    Screen for STD (sexually transmitted disease)/Pain in both testicles: Pt has been eating more normal (more meat) since last visit and wants chlamydia/gonorrhea checked one more time. If still no infection, then he's more receptive that his symptoms could be caused by left inguinal hernia as found by urologist. He does not want surgery for hernia.   -     Chlamydia trachomatis/Neisseria gonorrhoeae PCR, Urine; Future    Other fatigue  -     Testosterone, Free, Total and  SHBG, Men; Future    The above recommendation were discussed with the patient.  The patient has all questions answered satisfactorily and is in agreement with this recommended plan of care.    No follow-ups on file.     Author:  Don Broach 11/11/2019 8:21 AM

## 2019-11-13 LAB — Chlamydia trachomatis/Neisseria gonorrhoeae PCR: NEISSERIA GONORRHOEAE PCR: NEGATIVE

## 2019-11-13 LAB — Testosterone, Free, Total and  SHBG, Men: TESTOSTERONE, FREE CALCULATION: 120 pg/mL (ref 47–244)

## 2019-11-14 LAB — Ureaplasma/Genital Mycoplasma Culture: MYCOPLASMA/UREAPLASMA CULTURE: NEGATIVE

## 2019-11-19 ENCOUNTER — Telehealth: Payer: Self-pay | Admitting: Nurse Practitioner

## 2019-11-19 NOTE — Telephone Encounter (Signed)
Copied from CRM [id number]. Topic: General - Other >> Nov 19, 2019 10:16 AM Tamela Oddi wrote: Reason for CRM: Patient would like to talk with the nurse regarding his thyroid medication.  CB# [phone]

## 2019-11-19 NOTE — Telephone Encounter (Signed)
Called pt, no answer, unable to LVM

## 2019-11-19 NOTE — Telephone Encounter (Signed)
Pt stated that it was not making him feel good and he was nauseous and a little sick on the stomach.

## 2019-11-19 NOTE — Telephone Encounter (Signed)
It doesn't look like he is currently taking thyroid medication - if he would like to be evaluated for this, he will need an appointment.

## 2019-11-20 NOTE — Telephone Encounter (Signed)
Called and spoke with patient. Message relayed. Scheduled for Thursday, 11/21/19 at 10:30 am

## 2019-11-21 ENCOUNTER — Ambulatory Visit: Payer: Self-pay | Admitting: Nurse Practitioner

## 2020-04-16 ENCOUNTER — Telehealth: Payer: Self-pay | Admitting: Nurse Practitioner

## 2020-04-16 NOTE — Telephone Encounter (Signed)
-----   Message from Wells Guiles, NP sent at 10/28/2019  9:25 AM EDT ----- Please reach out to make an appointment for 6 month mood f/u

## 2020-04-16 NOTE — Telephone Encounter (Signed)
Called pt to schedule appt, no answer and unable to LVM.

## 2020-08-04 ENCOUNTER — Ambulatory Visit: Payer: BLUE CROSS/BLUE SHIELD | Attending: Registered"

## 2020-08-04 DIAGNOSIS — N50812 Left testicular pain: Secondary | ICD-10-CM

## 2020-08-04 DIAGNOSIS — Z113 Encounter for screening for infections with a predominantly sexual mode of transmission: Secondary | ICD-10-CM

## 2020-08-04 NOTE — Addendum Note
Addended byLeotis Pain L on: 08/04/2020 10:32 AM     Modules accepted: Orders

## 2020-08-04 NOTE — Addendum Note
Addended by: Lunette Stands on: 08/04/2020 10:36 AM     Modules accepted: Orders

## 2020-08-04 NOTE — Progress Notes
Outpatient Urgent Care Note    Patient: Jonathan Weeks  MRN: [id number]  Date of Service: 08/04/2020  PMD:    Chief Complaint:   Chief Complaint   Patient presents with   ??? Groin Pain     left side       HPI: Jonathan Weeks is a 25 y.o. male with testicular pain. H/o veins prominent in ultrasound. No blood in ejaculate but occas pain w ejaculation.     Chronic Conditions:  Past Medical History:   Diagnosis Date   ??? Occult blood in stools      No past surgical history on file.    MEDS:  Current Outpatient Medications   Medication Sig   ??? azithromycin 250 mg tablet Take 2 tablets (500 mg) on  Day 1,  followed by 1 tablet (250 mg) once daily on Days 2 through 5.. (Patient not taking: Reported on 09/30/2019.)   ??? meloxicam 7.5 mg tablet Take 1 tablet (7.5 mg total) by mouth daily with lunch. (Patient not taking: Reported on 08/04/2020.)   ??? sodium sulfate-potassium sulfate-magnesium sulfate solution At 6pm the evening before colonoscopy, drink 16oz x1 dose, then drink 32oz water over 1 hour. At 5am the day of colonoscopy, repeat above. (Patient not taking: Reported on 08/04/2020.)   ??? sodium sulfate-potassium sulfate-magnesium sulfate solution At 6pm the evening before colonoscopy, drink 16oz x1 dose, then drink 32oz water over 1 hour. At 5am the day of colonoscopy, repeat above. (Patient not taking: Reported on 08/04/2020.)     No current facility-administered medications for this visit.         ALL:  Allergies   Allergen Reactions   ??? Gluten Meal    ??? Wheat        Social History     Tobacco Use   ??? Smoking status: Never Smoker   ??? Smokeless tobacco: Never Used   Vaping Use   ??? Vaping Use: Never used   Substance Use Topics   ??? Alcohol use: Never     Alcohol/week: 0.0 oz   ??? Drug use: Never        PHYSICAL EXAM:  BP 122/79  ~ Pulse 63  ~ Temp 36.2 ???C (97.1 ???F) (Forehead)  ~ Ht 5' 10'' (1.778 m)  ~ Wt (!) 224 lb (101.6 kg)  ~ SpO2 97%  ~ BMI 32.14 kg/m???   Body mass index is 32.14 kg/m???.   HEENT NC/AT  .  Nasal passages clear. No sinus tenderness or facial swelling.   Psych: normal affect   testes: both descended, no masses        IMPRESSION/PLAN:   testicular pain- check ultrasound.   The patient understands and agrees with the above recommendations. Advised to call or return to clinic if symptoms worsening or not resolving.   Leotis Pain, MD  Internal Medicine  Clinical Professor of Medicine, [facility]  [address], [address]  [place], [place] [address]

## 2020-08-05 LAB — HIV-1/2 Ag/Ab 4th Generation with Reflex Confirmation: HIV-1/2 AG/AB 4TH GENERATION WITH REFLEX CONFIRMATION: NONREACTIVE

## 2020-08-05 LAB — RPR: RPR: NONREACTIVE

## 2020-08-05 LAB — HCV Ab Screen: HCV ANTIBODY SCREEN: NONREACTIVE

## 2020-08-05 LAB — HBs Ag: HEPATITIS B SURFACE ANTIGEN: NONREACTIVE

## 2020-08-06 LAB — Chlamydia trachomatis/Neisseria gonorrhoeae PCR: NEISSERIA GONORRHOEAE PCR: NEGATIVE

## 2020-10-13 ENCOUNTER — Telehealth: Payer: Self-pay

## 2020-10-13 NOTE — Telephone Encounter (Signed)
vm full unable to lvm to make physical apt as he is due.

## 2020-10-22 ENCOUNTER — Ambulatory Visit: Payer: BLUE CROSS/BLUE SHIELD

## 2020-12-02 ENCOUNTER — Ambulatory Visit: Payer: BLUE CROSS/BLUE SHIELD

## 2020-12-02 DIAGNOSIS — M5441 Lumbago with sciatica, right side: Secondary | ICD-10-CM

## 2020-12-02 DIAGNOSIS — Z0000 Encounter for general adult medical examination without abnormal findings: Secondary | ICD-10-CM

## 2020-12-02 NOTE — Progress Notes
PATIENT: Jonathan Weeks  MRN: [id number]  DOB: February 08, 1996  DATE OF SERVICE: 12/02/2020  CHIEF COMPLAINT:   Chief Complaint   Patient presents with   ??? Annual Exam     HISTORY OF PRESENT ILLNESS   Jonathan Weeks is a 25 y.o. male who is here for physical exam . C/o low back pain w right sciatica on and off. Tender left groin on and off. Parents had hernia. No persisiting bulge      PAST MEDICAL AND SURGICAL HISTORY:   Past Medical History:   Diagnosis Date   ??? Occult blood in stools       No past surgical history on file.    Family History   Problem Relation Age of Onset   ??? Colon cancer Paternal Aunt    ??? Breast cancer Maternal Aunt    ??? Breast cancer Maternal Aunt    ??? Skin cancer Neg Hx      SOC HISTORY:  Sleep 6-8 hours and Exercise occasional  Social History     Tobacco Use   ??? Smoking status: Never Smoker   ??? Smokeless tobacco: Never Used   Vaping Use   ??? Vaping Use: Never used   Substance Use Topics   ??? Alcohol use: Never     Alcohol/week: 0.0 oz   ??? Drug use: Never     MEDICATIONS:  Medications that the patient states to be currently taking   Medication Sig   ??? ketoconazole 2% cream APPLY TO AFFECTED AREA TWICE A DAY 2-3 TIMES A WEEK OR UNTIL RESOLUTION     ALLERGIES:   Allergies   Allergen Reactions   ??? Gluten Meal    ??? Wheat      IMMUNIZATIONS:  Immunization History   Administered Date(s) Administered   ??? DTaP 04/03/1997, 06/05/1997, 07/16/1997, 07/21/1998, 01/24/2001   ??? HPV 9-valent vaccine IM (Gardasil 9) (PF) SYR/SDV (38-34 years of age boys) (34-26 years of age girls/women) 10/18/2018   ??? Hepatitis A, unspecified formulation 12/22/2005, 12/06/2007   ??? Hepatitis B, unspecified formulation 03/03/1997, 05/05/1997, 10/15/1997   ??? Hib (PRP-T) 04/03/1997, 06/05/1997, 07/26/1997, 04/21/1998   ??? IPV 04/03/1997, 06/05/1997, 07/02/1998, 01/24/2001   ??? MMR 01/20/1998, 12/27/2001   ??? PPD Test 12/22/2005, 12/30/2005   ??? Rabies IM fibroblast culture (PCEC/RABAVERT) 10/23/2015, 11/18/2015, 11/23/2015   ??? Td 05/07/2009   ??? Tdap 10/23/2015   ??? varicella vaccine (Varivax) 01/19/1999      REVIEW OF SYSTEMS:  14 system review performed; all systems negative except as documented above in HPI    PHYSICAL EXAM   BP 122/75  ~ Pulse 64  ~ Temp 37 ???C (98.6 ???F) (Forehead)  ~ Resp 18  ~ SpO2 98%      BP Readings from Last 3 Encounters:   12/02/20 122/75   08/04/20 122/79   11/11/19 124/73      Wt Readings from Last 3 Encounters:   08/04/20 (!) 224 lb (101.6 kg)   11/11/19 206 lb 12.8 oz (93.8 kg)   11/06/19 201 lb (91.2 kg)      There is no height or weight on file to calculate BMI.    System Check if Normal Positive or additional negative findings   Constit  [x]  General appearance     Eyes  [x]  Conj/Lids [x]  Pupils  []  Fundi     ENMT  [x]  External ears/nose []  Otoscopy   []  Nasal mucosa   [x]  Lips/teeth/gums []  Oropharynx     Neck  [  x] Inspection/palpation []  Thyroid     Resp  [x]  Effort []  Percussion[x]  Auscultation     CV  [x]  Auscultation []  Lower extremities    []  Abd aorta []  Femoral  []  Pedal     Breast  [x]  Inspection [x]  Palpation     GI  [x]  Abd (masses or tenderness)   []  Liver/spleen []  Rectal     GU  M: []  Scrotum []  Penis []  Prostate   F:  []  External []  Bladder []  Cervix        []  Uterus []  Adnexa      Lymph  [x]  Neck []  Axillae []  Groin     MS  [x]  Gait []  Digits/nails  Specify site examined:    [x]  Inspect/palp [x]  ROM   [x]  Stability [x]  Strength/tone         Skin  [x]  Inspection [x]  Palpation     Neuro  [x]  CN2-12 [x]  DTRs  [x]  Sensation     Psych  [x]  Insight/judgement [x]  Orientation   [x]  Memory [x]  Mood/affect       LABS/STUDIES   I have:   []  Reviewed/ordered []  1 []  2 [x]  ? 3 unique laboratory, radiology, and/or diagnostic tests noted below    []  Reviewed []  1 [x]  2 []  ? 3 prior external notes and incorporated into patient assessment    []  Discussed management or test interpretation with external provider(s) as noted       LABS:  Lab Results   Component Value Date    TSH 1.3 02/11/2015     Lab Results Component Value Date    CHOL 193 10/21/2019    CHOL 174 10/18/2018    CHOL 151 02/11/2015    CHOLHDL 47 10/21/2019    CHOLHDL 50 10/18/2018    CHOLHDL 51 02/11/2015    CHOLDLCAL 131 (H) 10/21/2019    CHOLDLCAL 105 (H) 10/18/2018    CHOLDLCAL 88 02/11/2015    TRIGLY 77 10/21/2019    TRIGLY 95 10/18/2018    TRIGLY 62 02/11/2015     Lab Results   Component Value Date    HGBA1C 5.4 10/21/2019    HGBA1C 5.6 10/18/2018    HGBA1C 5.5 02/11/2015     Lab Results   Component Value Date    CREAT 1.05 10/21/2019    BUN 23 (H) 10/21/2019    NA 138 10/21/2019    K 4.5 10/21/2019    CL 106 10/21/2019    CO2 24 10/21/2019     Lab Results   Component Value Date    WBC 6.15 10/21/2019    HGB 15.2 10/21/2019    HGB 15.2 05/22/2019    HGB 15.4 10/18/2018    HCT 45.2 10/21/2019    HCT 45.5 05/22/2019    HCT 46.6 10/18/2018    MCV 92.6 10/21/2019    MCV 94.8 05/22/2019    MCV 96.9 10/18/2018    PLT 209 10/21/2019    PLT 185 05/22/2019     Lab Results   Component Value Date    ALT 32 10/21/2019    AST 28 10/21/2019    ALKPHOS 61 10/21/2019    BILITOT 0.8 10/21/2019     Lab Results   Component Value Date    CALCIUM 9.3 10/21/2019     No results found for: VITD25OH  No results found for: INR, INRPOC, PT  IMAGING:  No results found.   No imaging has been resulted in the last 365 days   Women's Screenings  Health Maintenance   Topic Date Due   ??? HPV Vaccines (2 - Male 3-dose series) 11/15/2018   ??? Tdap/Td Vaccine (3 - Td or Tdap) 10/22/2025   ??? Hepatitis B Screening  Completed   ??? Hepatitis C Screening  Completed   ??? HIV Screening  Completed   ??? Influenza Vaccine  Discontinued   ??? COVID-19 Vaccine(Tracks primary and booster doses, not sup/immunocomp)  Discontinued          A&P   Jonathan Weeks is a a 25 y.o. male presenting for:    ICD-10-CM    1. Preventative health care  Z00.00 CBC & Auto Differential     Comprehensive Metabolic Panel     Hgb A1c     Urinalysis w/Reflex to Culture     Vitamin D,25-Hydroxy     Pap Smear     Lipid Panel       There are no Patient Instructions on file for this visit.  No follow-ups on file.   The above recommendation were discussed with the patient.  The patient has all questions answered satisfactorily and is in agreement with this recommended plan of care.    Leotis Pain, MD  Internal Medicine  Clinical Professor of Medicine, [facility]  [address], [address]  [place], [place] [address]

## 2020-12-02 NOTE — Addendum Note
Addended by: Gwenette Greet on: 12/02/2020 11:32 AM     Modules accepted: Orders

## 2020-12-02 NOTE — Patient Instructions
X ray lumbar  Physical therapy

## 2020-12-02 NOTE — Nursing Note
labs drawn and urine collected and processed for BURL;pt tolerated well.  Ref and avs given. pt discharged.

## 2020-12-03 LAB — Comprehensive Metabolic Panel
ALBUMIN: 4.6 g/dL (ref 3.9–5.0)
UREA NITROGEN: 19 mg/dL (ref 7–22)

## 2020-12-03 LAB — Vitamin D,25-Hydroxy: VITAMIN D,25-HYDROXY: 19 ng/mL — ABNORMAL LOW (ref 20–50)

## 2020-12-03 LAB — Lipid Panel: CHOLESTEROL, HDL: 30 mg/dL — ABNORMAL LOW (ref >40)

## 2020-12-03 LAB — UA,Microscopic: RBCS HPF: 0 {cells}/[HPF] (ref 0–2)

## 2020-12-03 LAB — CBC: MCH CONCENTRATION: 33 g/dL (ref 31.5–35.5)

## 2020-12-03 LAB — UA,Dipstick

## 2020-12-03 LAB — Hgb A1c: HGB A1C - HPLC: 5.7 — ABNORMAL HIGH (ref <5.7)

## 2020-12-03 LAB — Differential Automated: ABSOLUTE IMMATURE GRAN COUNT: 0.03 10*3/uL (ref 0.00–0.04)

## 2020-12-04 ENCOUNTER — Ambulatory Visit: Payer: BLUE CROSS/BLUE SHIELD

## 2020-12-04 DIAGNOSIS — Z113 Encounter for screening for infections with a predominantly sexual mode of transmission: Secondary | ICD-10-CM

## 2020-12-30 ENCOUNTER — Ambulatory Visit: Payer: BLUE CROSS/BLUE SHIELD

## 2020-12-31 ENCOUNTER — Ambulatory Visit: Payer: BLUE CROSS/BLUE SHIELD

## 2021-07-13 ENCOUNTER — Other Ambulatory Visit: Payer: Self-pay

## 2021-07-13 ENCOUNTER — Ambulatory Visit
Admission: EM | Admit: 2021-07-13 | Discharge: 2021-07-13 | Disposition: A | Discharge status: Home / Self Care | Payer: Self-pay | Attending: Internal Medicine | Admitting: Internal Medicine

## 2021-07-13 ENCOUNTER — Encounter: Payer: Self-pay | Admitting: Emergency Medicine

## 2021-07-13 DIAGNOSIS — B002 Herpesviral gingivostomatitis and pharyngotonsillitis: Secondary | ICD-10-CM | POA: Insufficient documentation

## 2021-07-13 LAB — HIV ANTIBODY (ROUTINE TESTING W REFLEX): HIV Screen 4th Generation wRfx: NONREACTIVE

## 2021-07-13 MED ORDER — LIDOCAINE VISCOUS HCL 2 % MT SOLN: 15.0000 mL | OROMUCOSAL | 0 refills | Status: AC | PRN

## 2021-07-13 MED ORDER — FAMCICLOVIR 500 MG PO TABS: 500.0000 mg | ORAL_TABLET | Freq: Two times a day (BID) | ORAL | 0 refills | Status: AC

## 2021-07-13 NOTE — ED Triage Notes (Signed)
Pt presents today with c/o of upper mouth pain with lip lesions x 6 days. Temp 100.5 today. Took Tylenol today 6a.

## 2021-07-13 NOTE — ED Provider Notes (Signed)
[facility]    CSN: [id number] Arrival date & time: 07/13/21  1110      History   Chief Complaint Chief Complaint  Patient presents with   Mouth Lesions   Oral Swelling    HPI Jose Pena is a 25 y.o. male.  Presents today with onset 6 days ago of flulike symptoms, with fever, myalgias, and lower lip and mouth pain.  He has a lot of pain in his gums around his teeth, in the roof of his mouth, and a large blistery patch with some hemorrhagic crusting on the lower lip.  No prior history of fever blisters.  Staying hydrated, but very difficult to eat.  Not much cough, sore throat.  Not much runny nose.  Not vomiting, no diarrhea.  No rash or skin sores elsewhere.  HPI  Past Medical History:  Diagnosis Date   Malnutrition of mild degree (HCC) 12/21/2016   Sleep deprivation 12/21/2016   Thyroid disease    Urticaria 11/30/2015    Patient Active Problem List   Diagnosis Date Noted   Anxiety 07/14/2019   Hyperthyroidism 12/21/2016    Past Surgical History:  Procedure Laterality Date   REPAIR EXTENSOR TENDON Left 09/19/2016   Procedure: REPAIR EXTENSOR TENDON;  Surgeon: Deeann Saint, MD;  Location: [facility] ORS;  Service: Orthopedics;  Laterality: Left;   TONSILLECTOMY AND ADENOIDECTOMY         Home Medications    Prior to Admission medications   Medication Sig Start Date End Date Taking? Authorizing Provider  famciclovir (FAMVIR) 500 MG tablet Take 1 tablet (500 mg total) by mouth 2 (two) times daily for 10 days. 07/13/21 07/23/21 Yes Isa Rankin, MD  lidocaine (XYLOCAINE) 2 % solution Use as directed 15 mLs in the mouth or throat every 3 (three) hours as needed for mouth pain. 07/13/21  Yes Isa Rankin, MD  ARIPiprazole (ABILIFY) 5 MG tablet Take 1 tablet (5 mg total) by mouth daily. 10/28/19   Valentino Nose, NP    Family History History reviewed. No pertinent family history.  Social History Social History   Tobacco Use    Smoking status: Never   Smokeless tobacco: Never   Tobacco comments:    Patient vapes  Vaping Use   Vaping Use: Every day  Substance Use Topics   Alcohol use: Yes    Comment: Occassionally   Drug use: No     Allergies   Other Not allergic to any medications  Review of Systems Review of Systems see HPI   Physical Exam Triage Vital Signs ED Triage Vitals [07/13/21 1146]  Enc Vitals Group     BP 134/78     Pulse Rate (!) 106     Resp 18     Temp (!) 100.5 F (38.1 C)     Temp Source Oral     SpO2 96 %     Weight      Height      Pain Score 4     Pain Loc    Updated Vital Signs BP 134/78 (BP Location: Right Arm)    Pulse (!) 106    Temp (!) 100.5 F (38.1 C) (Oral)    Resp 18    SpO2 96%   Physical Exam Constitutional:      General: He is not in acute distress.    Appearance: He is not ill-appearing or toxic-appearing.     Comments: Nicely groomed  HENT:     Head: Atraumatic.  Comments: Lower lip with a 3 cm patch of vesicles, and some hemorrhagic crusting Erosions/ulcerations noted over the hard palate, and at multiple sites on the gums around the teeth Posterior pharynx with some erosions/ulcerations noted as well Eyes:     Conjunctiva/sclera:     Right eye: Right conjunctiva is not injected. No exudate.    Left eye: Left conjunctiva is not injected. No exudate.    Comments: Conjugate gaze observed   Cardiovascular:     Rate and Rhythm: Regular rhythm.     Comments: Slightly tachycardic Pulmonary:     Effort: Pulmonary effort is normal. No respiratory distress.     Breath sounds: No wheezing or rhonchi.  Abdominal:     General: There is no distension.  Musculoskeletal:     Cervical back: Neck supple.     Comments: Walked into the urgent care independently  Skin:    General: Skin is warm and dry.     Comments: No cyanosis  Neurological:     Mental Status: He is alert.     Comments: Face is symmetric, speech is clear, coherent, logical     UC  Treatments / Results  Labs (all labs ordered are listed, but only abnormal results are displayed) Labs Reviewed  HIV ANTIBODY (ROUTINE TESTING W REFLEX)  HIV Ab was negative; encouraged patient to repeat test in 6-12 weeks if initial test was negative.  EKG N/A  Radiology No results found. No imaging at urgent care today  Procedures Procedures (including critical care time) N/A  Medications Ordered in UC Medications - No data to display No meds given at urgent care today     Final Clinical Impressions(s) / UC Diagnoses   Final diagnoses:  Oral herpes simplex infection     Discharge Instructions      Symptoms today are most consistent with a primary herpes virus infection.  This may have been brought on by infection with a flu-like illness or it may be the cause of your flu-like symptoms.  Prescriptions for famciclovir (for the herpes virus) and viscous lidocaine (apply to painful areas for pain relief) were sent to the pharmacy.  Test for HIV was drawn today, as HIV infection sometimes travels with the herpes virus germ.  If this test is negative, please have it repeated in 6-12 weeks since treatments are available for HIV.  Anticipate gradual improvement in the mouth pain and sores over the next 7-10 days.  Recheck for persistent fever (>3 days after starting the famciclovir), new or nonimproving mouth/lip sores (>3 days after starting the famciclovir), inability to maintain hydration.  Open sores are contagious and can be spread to others.      ED Prescriptions     Medication Sig Dispense Auth. Provider   famciclovir (FAMVIR) 500 MG tablet Take 1 tablet (500 mg total) by mouth 2 (two) times daily for 10 days. 20 tablet Isa Rankin, MD   lidocaine (XYLOCAINE) 2 % solution Use as directed 15 mLs in the mouth or throat every 3 (three) hours as needed for mouth pain. 100 mL Isa Rankin, MD      PDMP not reviewed this encounter.   Isa Rankin,  MD 07/14/21 [id number]

## 2021-07-13 NOTE — Discharge Instructions (Addendum)
Symptoms today are most consistent with a primary herpes virus infection.  This may have been brought on by infection with a flu-like illness or it may be the cause of your flu-like symptoms.  Prescriptions for famciclovir (for the herpes virus) and viscous lidocaine (apply to painful areas for pain relief) were sent to the pharmacy.  Test for HIV was drawn today, as HIV infection sometimes travels with the herpes virus germ.  If this test is negative, please have it repeated in 6-12 weeks since treatments are available for HIV.  Anticipate gradual improvement in the mouth pain and sores over the next 7-10 days.  Recheck for persistent fever (>3 days after starting the famciclovir), new or nonimproving mouth/lip sores (>3 days after starting the famciclovir), inability to maintain hydration.  Open sores are contagious and can be spread to others.

## 2021-10-29 ENCOUNTER — Encounter: Payer: Self-pay | Admitting: Internal Medicine

## 2021-12-17 ENCOUNTER — Ambulatory Visit: Payer: Self-pay | Admitting: Family Medicine

## 2021-12-29 ENCOUNTER — Ambulatory Visit: Payer: BLUE CROSS/BLUE SHIELD | Attending: Student in an Organized Health Care Education/Training Program

## 2021-12-29 DIAGNOSIS — Z0189 Encounter for other specified special examinations: Secondary | ICD-10-CM

## 2021-12-29 DIAGNOSIS — H6123 Impacted cerumen, bilateral: Secondary | ICD-10-CM

## 2021-12-29 DIAGNOSIS — Z0000 Encounter for general adult medical examination without abnormal findings: Secondary | ICD-10-CM

## 2021-12-29 NOTE — Progress Notes
PATIENT: Jonathan Weeks  MRN: [id number]  DOB: 1995-12-11  DATE OF SERVICE: 12/29/2021    CHIEF COMPLAINT:   Chief Complaint   Patient presents with   ? Annual Exam        HPI   [name] is a 26 y.o. male who  has a past medical history of Occult blood in stools. who presents for   Chief Complaint   Patient presents with   ? Annual Exam     PCP: Dr. Melina Modena    Here for physical  Pt would like his testosterone levels checked  Denies any decreased libido or fatigue  He reports that he would like it checked because he works out a lot and would like to know   Reports had it checked last year      Pt reports he does not want to get his second HPV vaccine      Patient Care Team:  Hoover Browns, MD as PCP - General (Internal Medicine)    Social History     Social History Narrative   ? Not on file         ROS   A 14 point review of systems was completed and is negative except as described above.    MEDS     No current outpatient medications on file.      PHYSICAL EXAM      Last Recorded Vital Signs:    12/29/21 1002   BP: 117/76   Pulse: 73   Resp: 16   SpO2: 97%     Body mass index is 28.9 kg/m?Marland Kitchen  Wt Readings from Last 3 Encounters:   12/29/21 207 lb 3.2 oz (94 kg)   08/04/20 (!) 224 lb (101.6 kg)   11/11/19 206 lb 12.8 oz (93.8 kg)        Physical Exam  Vitals and nursing note reviewed.   Constitutional:       General: He is not in acute distress.     Appearance: Normal appearance. He is normal weight. He is not ill-appearing, toxic-appearing or diaphoretic.   HENT:      Head: Normocephalic and atraumatic.      Right Ear: Ear canal and external ear normal. There is impacted cerumen.      Left Ear: Ear canal and external ear normal. There is impacted cerumen.      Nose: Nose normal. No congestion or rhinorrhea.      Mouth/Throat:      Mouth: Mucous membranes are moist.      Pharynx: Oropharynx is clear. No oropharyngeal exudate or posterior oropharyngeal erythema.   Eyes:      General: No scleral icterus. Right eye: No discharge.         Left eye: No discharge.      Extraocular Movements: Extraocular movements intact.      Conjunctiva/sclera: Conjunctivae normal.      Pupils: Pupils are equal, round, and reactive to light.   Cardiovascular:      Rate and Rhythm: Normal rate and regular rhythm.      Pulses: Normal pulses.      Heart sounds: Normal heart sounds. No murmur heard.     No friction rub. No gallop.   Pulmonary:      Effort: Pulmonary effort is normal. No respiratory distress.      Breath sounds: Normal breath sounds. No stridor. No wheezing or rales.   Abdominal:      General: Abdomen is flat.  There is no distension.      Palpations: Abdomen is soft. There is no mass.      Tenderness: There is no abdominal tenderness. There is no guarding or rebound.   Lymphadenopathy:      Cervical: No cervical adenopathy.   Neurological:      Mental Status: He is alert.           LABS/STUDIES   Lab Studies:      Imaging Studies:       Medical Decision Making Addressed on  :     Number and Complexity of Problems Addressed at the Encounter:    []   1 self-limited/minor     []   2 self-limited/minor   []   1 chronic stable   []   1 acute illness/injury    []   1 or more chronic illness with exacerbation, progression, or side effects of treatment  []   2 or more stable chronic illness  []   1 undiagnosed new problem with uncertain prognosis  []   1 acute systemic injury/complicated illness    []   1 chronic severe exacerbation  []   1 acute or chronic causing threat to life/bodily function     Review of Data: I have  I have:   []  Reviewed/ordered []  1 []  2 []  ? 3 unique laboratory, radiology, and/or diagnostic tests noted below    []  Reviewed []  1 []  2 []  ? 3 prior external notes and incorporated into patient assessment    []  Independent Historian  []  I have independently interpreted test performed by other physician(s) as noted   []  Discussed management or test interpretation with external provider(s) as noted     Risk:  []  OTC Meds, PT, OT    []  Prescription drug management  []  Minor surgery with risk factors  []  Elective major surgery without risk factors  []  dx or treatments affected by SDOH     []  Intensive monitoring for drug toxicity   []  Elective major surgery with risk factors   []  Emergency major surgery  []  Need for hospitalization  []  Need for DNR or de-escalation of care       Risk of Complication and or Morbidity or Mortality of Patient Management including Social Determinants of Health:   []   I deem the above diagnoses to have a risk of complication, morbidity or mortality of Moderate  []  The diagnosis or treatment of said conditions is significantly limited by social determinants of health as noted.     I spent a total of ... minutes counseling/coordinating care of the items checked below on the date of this encounter.  []  Preparing to see the patient  []  Obtaining and/or reviewing separately obtained history  []  Performing a medically appropriate examination/evaluation  []  Counseling and educating the patient/family/caregiver  []  Ordering medication, tests, or procedures  []  Documenting clinical information in the EMR  []  Communicating with other healthcare professionals  []  Independently interpreting results and communicating the results to the patient/family/caregiver    A&P   Audel A Coppola is a 26 y.o. male presenting for   Chief Complaint   Patient presents with   ? Annual Exam         PROBLEM & ORDERS    ICD-10-CM    1. Annual physical exam  Z00.00 CBC & Auto Differential     Comprehensive Metabolic Panel     Hgb A1c     Lipid Panel     TSH with reflex FT4, FT3  Vitamin D,25-Hydroxy     HIV-1/2 Ag/Ab 4th Generation with Reflex Confirmation     HBS Antigen     HCV Antibody Screen     RPR with TP-PA     Chlamydia trachomatis/Neisseria gonorrhoeae PCR, Urine     Testosterone    nonfasting labs. eye exam yearly, q6 month dental exam      2. Patient request for diagnostic testing  Z01.89 Testosterone      3. Bilateral impacted cerumen  H61.23     offered ear lavage, pt declined. OTC debrox is recommended.           ASSESSMENT    Problem List Items Addressed This Visit    None  Visit Diagnoses     Annual physical exam    -  Primary    nonfasting labs. eye exam yearly, q6 month dental exam    Relevant Orders    CBC & Auto Differential    Comprehensive Metabolic Panel    Hgb A1c    Lipid Panel    TSH with reflex FT4, FT3    Vitamin D,25-Hydroxy    HIV-1/2 Ag/Ab 4th Generation with Reflex Confirmation    HBS Antigen    HCV Antibody Screen    RPR with TP-PA    Chlamydia trachomatis/Neisseria gonorrhoeae PCR, Urine    Testosterone    Patient request for diagnostic testing        Relevant Orders    Testosterone    Bilateral impacted cerumen        offered ear lavage, pt declined. OTC debrox is recommended.          Advised pt that he is due for his 2nd HPV vaccine, pt declined and stated that he doesn't want it, when asked his reason he stated he just did not want it, I discussed with pt that he is not fully protected as he only received 1 dose.   Pt requesting testosterone checked. I discussed with pt that testosterone levels are not indicated at this point, pt reports he had it checked last year and would like it checked again. He stated that the only reason why he came to see me for his physical was because his regular PCP did not have any availability and I was the only one available. I discussed with patient that testosterone levels might not be covered by insurance and if he gets it drawn might be falsely low given that it is after 8am and for patient to come back in the morning time, pt reported that he would not come back and would like it to be checked today. Testosterone levels ordered    Follow up with PCP.     The above recommendation were discussed with the patient.  The patient has all questions answered satisfactorily and is in agreement with this recommended plan of care.    No follow-ups on file.     Doran Durand, DO  Family Medicine  Clinical Instructor  Department of Medicine  12/29/2021 10:31 AM

## 2021-12-30 ENCOUNTER — Ambulatory Visit: Payer: BLUE CROSS/BLUE SHIELD

## 2021-12-30 LAB — HIV-1/2 Ag/Ab 4th Generation with Reflex Confirmation: HIV-1/2 AG/AB 4TH GENERATION WITH REFLEX CONFIRMATION: NONREACTIVE

## 2021-12-30 LAB — Comprehensive Metabolic Panel
ASPARTATE AMINOTRANSFERASE: 43 U/L (ref 13–62)
CHLORIDE: 104 mmol/L (ref 96–106)

## 2021-12-30 LAB — Differential Automated: ABSOLUTE NEUT COUNT: 2.94 10*3/uL (ref 1.80–6.90)

## 2021-12-30 LAB — HCV Ab Screen: HCV ANTIBODY SCREEN: NONREACTIVE

## 2021-12-30 LAB — Testosterone: TESTOSTERONE: 387 ng/dL (ref 200–1000)

## 2021-12-30 LAB — CBC: MCH CONCENTRATION: 33.4 g/dL (ref 31.5–35.5)

## 2021-12-30 LAB — RPR with TP-PA Confirm: RPR WITH TP-PA CONFIRMATION: NONREACTIVE

## 2021-12-30 LAB — Vitamin D,25-Hydroxy: VITAMIN D,25-HYDROXY: 23 ng/mL (ref 20–50)

## 2021-12-30 LAB — HBs Ag: HEPATITIS B SURFACE ANTIGEN: NONREACTIVE

## 2021-12-30 LAB — Hgb A1c: HGB A1C - HPLC: 5.8 — ABNORMAL HIGH (ref <5.7)

## 2021-12-30 LAB — TSH with reflex FT4, FT3: TSH: 0.51 u[IU]/mL (ref 0.3–4.7)

## 2021-12-30 LAB — Lipid Panel: TRIGLYCERIDES: 211 mg/dL — ABNORMAL HIGH (ref >40–<150)

## 2022-01-03 LAB — Chlamydia trachomatis/Neisseria gonorrhoeae PCR: NEISSERIA GONORRHOEAE PCR: NEGATIVE

## 2022-06-01 ENCOUNTER — Ambulatory Visit: Payer: BLUE CROSS/BLUE SHIELD

## 2022-06-06 ENCOUNTER — Ambulatory Visit: Payer: BLUE CROSS/BLUE SHIELD

## 2022-06-06 DIAGNOSIS — J029 Acute pharyngitis, unspecified: Secondary | ICD-10-CM

## 2022-06-07 LAB — HIV-1/2 Ag/Ab 4th Generation with Reflex Confirmation: HIV-1/2 AG/AB 4TH GENERATION WITH REFLEX CONFIRMATION: NONREACTIVE

## 2022-06-07 LAB — RPR with TP-PA Confirm: RPR WITH TP-PA CONFIRMATION: NONREACTIVE

## 2022-06-07 NOTE — Progress Notes
Chief Complaint   Patient presents with   ? Oral Swelling     Tongue pain with sore throat     HPI:   Jonathan Weeks is a a 26 y.o. male  has a past medical history of Occult blood in stools. who presents for Oral Swelling (Tongue pain with sore throat)       #Oral pain / swelling: noted for past 4 months. Improved with turmeric but recurs after stopping.  Tongue with mild yellowing / tonsil swelling/erythema, mild yellow mucous in back of throat.  Performed oral sex on woman met on [place] prior to symptoms started. Partner denied STD.        No current outpatient medications on file.     No current facility-administered medications for this visit.       Gluten meal and Wheat    ROS: 14 point review conducted by me and negative except as per HPI.        PHYSICAL EXAM:     BP 131/73  ~ Pulse 72  ~ Temp 36.7 ?C (98 ?F)  ~ Ht 5' 11'' (1.803 m)  ~ Wt 205 lb (93 kg)  ~ SpO2 99%  ~ BMI 28.59 kg/m?   GEN: alert, well appearing, and in no distress  Eyes:sclera anicteric, PERRLA, EOMI, conjunctiva w/o pallor  ENMT: mild posterior erythema, no tonsil swelling nose shows no deformity, asymmetry, or inflammation, mucous membranes moist, pharynx normal without lesions,   Neck: supple, normal ROM  CV: no JVD, no peripheral edema.  Resp: no tachypnea, retractions or cyanosis.  GI: not examined.  MSK: nl bulk and tone, no joint tenderness, deformity or swelling.  Integumentary: no rashes, no ecchymoses, no nodules, no jaundice  Neuro: alert, oriented, normal speech, no focal findings or movement disorder noted.  Psych: normal mood, behavior, speech, dress, and thought processes      LABORATORY    Results for orders placed or performed in visit on 12/29/21   Chlamydia trachomatis/Neisseria gonorrhoeae PCR, Urine    Specimen: Clean Catch, Midstream; Urine   Result Value Ref Range    Chlamydia trachomatis PCR Negative Negative    Neisseria gonorrhoeae PCR Negative Negative    Specimen Type Urine    Comprehensive Metabolic Panel Result Value Ref Range    Sodium 140 135 - 146 mmol/L    Potassium 4.2 3.6 - 5.3 mmol/L    Chloride 104 96 - 106 mmol/L    Total CO2 22 20 - 30 mmol/L    Anion Gap 14 8 - 19 mmol/L    Glucose 103 (H) 65 - 99 mg/dL    Creatinine 1.30 8.65 - 1.30 mg/dL    Estimated GFR >78 See GFR Additional Information mL/min/1.70m2    GFR Additional Information See Comment      Comment: GFR >89.........Marland KitchenNormal   GFR 60 - 89...Marland KitchenMarland KitchenNormal to mildly decreased   GFR 45 - 59...Marland KitchenMarland KitchenMildly to moderately decreased   GFR 30 - 44...Marland KitchenMarland KitchenModerately to severely decreased   GFR 15 - 29...Marland KitchenMarland KitchenSeverely decreased   GFR <15.........Marland KitchenKidney failure   The 2021 CKD-EPI creatinine equation was used to calculate the estimated GFR and assumes stable creatinine concentrations.   Results are in mL/min/1.73 square meters.  The patient's eGFR MAY need to be adjusted for drug dosing.   For drug dosing, utilize eGFR or eCrCl.   If using the eGFR in very large or very small patients, then multiply the reported eGFR by the estimated BSA and divide by 1.73 m2, in order  to obtain eGFR in units of mL/min.    Urea Nitrogen 18 7 - 22 mg/dL    Calcium 9.5 8.6 - 16.1 mg/dL    Total Protein 7.1 6.1 - 8.2 g/dL    Albumin 4.7 3.9 - 5.0 g/dL    Bilirubin,Total 0.3 0.1 - 1.2 mg/dL    Alkaline Phosphatase 64 37 - 113 U/L    Aspartate Aminotransferase 43 13 - 62 U/L    Alanine Aminotransferase 30 8 - 70 U/L   Hgb A1c   Result Value Ref Range    Hgb A1c - HPLC 5.8 (H) <5.7 %     Comment: For patients with diabetes, an A1c less than (<) or equal (=) to 7.0% is recommended for most patients, however the goal may be higher or lower depending on age and/or other medical problems.   For a diagnosis of diabetes, A1c greater than (>) or equal(=) to 6.5% indicates diabetes; values between 5.7% and 6.4% may indicate an increased risk of developing diabetes.   Lipid Panel   Result Value Ref Range    Cholesterol 200 See Comment mg/dL     Comment: The significance of total cholesterol depends on the values of LDL, HDL, triglycerides and the clinical context. A patient-provider discussion may be considered.        Cholesterol,LDL,Calc 134 (H) <100 mg/dL     Comment: Patient is non-fasting, interpret with caution.  If LDL value falls outside of the designated range AND if  included in any of the following categories, a  patient-provider discussion is recommended.     Statin therapy is recommended for individuals:  1. with clinical atherosclerotic cardiovascular disease     irrespective of LDL levels;  2. with LDL > or = 190 mg/dL;  3. with diabetes, aged 40-75 years, with LDL between 70 and     189 mg/dL;  4. without any of the above but who have LDL between 70 and     189 mg/dL and an estimated 09-UEAV risk of     atherosclerotic cardiovascular disease > or = 7.5%     (consider statin therapy if estimated 10-year risk > or =     5.0%) (ACC/AHA 2013 Guidelines).    Cholesterol, HDL 24 (L) >40 mg/dL     Comment: If HDL cholesterol level falls outside of the designated  range, a patient-provider discussion is recommended    Triglycerides 211 (H) <150 mg/dL     Comment: Patient is non-fasting, interpret with caution.  If Triglyceride level falls outside of the designated range,  a patient-provider discussion is recommended.      Non-HDL,Chol,Calc 176 (H) <130 mg/dL     Comment: If Non-HDL cholesterol level falls outside of the designated  range, a patient-provider discussion is recommended.   TSH with reflex FT4, FT3   Result Value Ref Range    TSH 0.51 0.3 - 4.7 mcIU/mL     Comment: TSH is normal, no further Thyroid tests were performed.    If applicable TSH pregnancy reference intervals:   First trimester (10-[redacted] weeks gestation): 0.03- 4.0 mcIU/mL  Second trimester (14-[redacted] weeks gestation): 0.19- 4.0 mcIU/mL     Vitamin D,25-Hydroxy   Result Value Ref Range    Vitamin D,25-Hydroxy 23 20 - 50 ng/mL     Comment: Ingestion of high levels of biotin in dietary supplements may lead to falsely increased results.    Deficiency: less than 12 ng/mL  Inadequate: 12-19 ng/mL                                                       Adequate: 20-50 ng/mL                                                             Potential adverse effects: greater than 50 ng/mL       RPR with TP-PA   Result Value Ref Range    RPR with TP-PA Confirmation Nonreactive Nonreactive   Testosterone   Result Value Ref Range    Testosterone 387 200 - 1,000 ng/dL     Comment: TESTOSTERONE REFERENCE RANGE;   Adult male:    430 601 1569 ng/dL   Adult male:   78-46   ng/dL    Ingestion of high levels of biotin in dietary supplements may lead to falsely increased results.   CBC   Result Value Ref Range    White Blood Cell Count 5.42 4.16 - 9.95 x10E3/uL    Red Blood Cell Count 4.94 4.41 - 5.95 x10E6/uL    Hemoglobin 15.0 13.5 - 17.1 g/dL    Hematocrit 96.2 95.2 - 52.0 %    Mean Corpuscular Volume 90.9 79.3 - 98.6 fL    Mean Corpuscular Hemoglobin 30.4 26.4 - 33.4 pg    MCH Concentration 33.4 31.5 - 35.5 g/dL    Red Cell Distribution Width-SD 41.3 36.9 - 48.3 fL    Red Cell Distribution Width-CV 12.5 11.1 - 15.5 %    Platelet Count, Auto 220 143 - 398 x10E3/uL    Mean Platelet Volume 11.3 9.3 - 13.0 fL    Nucleated RBC%, automated 0.0 No Ref. Range %     Comment: Percent Reference Range Not Reported per accrediting agency    Absolute Nucleated RBC Count 0.00 0.00 - 0.00 x10E3/uL    Neutrophil Abs (Prelim) 2.94 See Absolute Neut Ct. x10E3/uL     Comment: This is a preliminary result.  If automated differential see Absolute Neut  Count or if manual differential see Absolute Neut Ct, Manual for final result.   Differential, Automated   Result Value Ref Range    Neutrophil Percent, Auto 54.2 No Ref. Range %     Comment: Percent reference range not reported per accrediting agency.      Lymphocyte Percent, Auto 37.1 No Ref. Range %     Comment: Percent reference range not reported per accrediting agency. Monocyte Percent, Auto 7.4 No Ref. Range %     Comment: Percent reference range not reported per accrediting agency.      Eosinophil Percent, Auto 0.7 No Ref. Range %     Comment: Percent reference range not reported per accrediting agency.      Basophil Percent, Auto 0.4 No Ref. Range %     Comment: Percent reference range not reported per accrediting agency.      Immature Granulocytes% 0.2 No Reference Range %     Comment: Percent reference range not reported per accrediting agency.      Absolute Neut Count 2.94 1.80 - 6.90 x10E3/uL    Absolute Lymphocyte Count 2.01  1.30 - 3.40 x10E3/uL    Absolute Mono Count 0.40 0.20 - 0.80 x10E3/uL    Absolute Eos Count 0.04 0.00 - 0.50 x10E3/uL    Absolute Baso Count 0.02 0.00 - 0.10 x10E3/uL    Absolute Immature Gran Count 0.01 0.00 - 0.04 x10E3/uL   HIV-1/2 Ag/Ab 4th Generation with Reflex Confirmation   Result Value Ref Range    HIV-1/2 Ag/Ab Screen 4th Generation Nonreactive Nonreactive     Comment: This test was performed using the Elecsys HIV Duo assay on a Roche [id number] analyzer.   HBS Antigen   Result Value Ref Range    HBs Ag Nonreactive Nonreactive   HCV Antibody Screen   Result Value Ref Range    HCV Ab Screen Nonreactive Nonreactive         ASSESSMENT AND PLAN     Hilary A Ahart is a 26 y.o. male  has a past medical history of Occult blood in stools. who presents for Oral Swelling (Tongue pain with sore throat)    Diagnoses and all orders for this visit:    Pharyngitis, unspecified etiology: c/f STI given unprotected sex  -     HIV-1/2 Ag/Ab 4th Generation with Reflex Confirmation; Future  -     RPR with TP-PA; Future  -     Chlamydia trachomatis/Neisseria gonorrhoeae PCR, Urine; Future  -     Chlamydia trachomatis/Neisseria gonorrhoeae PCR, Respiratory Upper; Future        Fu PRN      The above recommendation were discussed with the patient.  The patient has all questions answered satisfactorily and is in agreement with this recommended plan of care. Return and ER precautions given.

## 2022-06-08 ENCOUNTER — Telehealth: Payer: BLUE CROSS/BLUE SHIELD

## 2022-06-08 ENCOUNTER — Ambulatory Visit: Payer: BLUE CROSS/BLUE SHIELD

## 2022-06-08 DIAGNOSIS — J029 Acute pharyngitis, unspecified: Secondary | ICD-10-CM

## 2022-06-08 DIAGNOSIS — B370 Candidal stomatitis: Secondary | ICD-10-CM

## 2022-06-08 LAB — Chlamydia trachomatis/Neisseria gonorrhoeae PCR
CHLAMYDIA TRACHOMATIS PCR: NEGATIVE
CHLAMYDIA TRACHOMATIS PCR: NEGATIVE

## 2022-06-08 NOTE — Telephone Encounter
Called patient, LVM. Per Dr. Berdie Ogren patient to come in to office for re-evaluation. Patient advised to walk into [facility] and provided with [facility] hours.

## 2022-06-08 NOTE — Telephone Encounter
Call Back Request      Reason for call back: Patient states that he would like Antibiotics for fungal infection. Patient was in on Monday and saw Dr. Berdie Ogren for this. Per pt he Cant eat because it hurts and is now fasting.     Any Symptoms:  []  Yes  [x]  No       If yes, what symptoms are you experiencing:    o Duration of symptoms (how long):    o Have you taken medication for symptoms (OTC or Rx):      If call was taken outside of clinic hours:    [] Patient or caller has been notified that this message was sent outside of normal clinic hours.     [] Patient or caller has been warm transferred to the physician's answering service. If applicable, patient or caller informed to please call back if symptoms progress.  Patient or caller has been notified of the turnaround time of 1-2 business day(s).

## 2022-06-09 LAB — Infectious Mono Ab: INFECTIOUS MONONUCLEOSIS AB: NONREACTIVE

## 2022-06-09 MED ORDER — AMOXICILLIN-POT CLAVULANATE 875-125 MG PO TABS
1 | ORAL_TABLET | Freq: Two times a day (BID) | ORAL | 0 refills | Status: AC
Start: 2022-06-09 — End: ?

## 2022-06-09 MED ORDER — NYSTATIN 100000 UNIT/ML MT SUSP
500000 [IU] | Freq: Four times a day (QID) | ORAL | 0 refills | Status: AC
Start: 2022-06-09 — End: ?

## 2022-06-09 NOTE — Progress Notes
[facility] INTERNAL MEDICINE & [facility]ED[facility]ATRICS  [facility]  [address] [place]  [address]  [place] [address]  Phone: [phone]  FAX: [phone]    URGENT CARE - ADULT    Subjective:     CC: re evaluation of sore throat and yellowing of tongue    Patient Active Problem List   Diagnosis   (none) - all problems resolved or deleted       HPI:   Jonathan Weeks is a 26 y.o. male with the above issues presenting with the following:    #sore throat  - I reviewed note by Dr. Berdie Ogren on 06/06/22, pt w oral pain/swelling for 4 months following oral sex w tongue yellowing, hiv, rpr, g/c all negative  - pt reporting persistent sore throat as well as yellowing of tongue. Helps to not eat but otherwise sxs persisting  - no fevers  - sxs started 2-3 days following oral sex w a girl  - yellowing of tongue did not scrape off and not c/w oral thrush  - will send in throat cultuer and treat w course of augmentin for possinle bacterial pharyngitis following oral sex. Will send in nystatin swish given possibility of oral thrush though low and given low SE profile of medication  - referral to ID in case sxs don't improve    Past Medical History  He has a past medical history of Occult blood in stools.    Medications/Supplements  No outpatient medications have been marked as taking for the 06/08/22 encounter (Office Visit) with Beroukhim Afrahimi, Maud Deed, MD.       Objective:     Physical Exam  BP 133/85  ~ Pulse 62  ~ Temp 36.7 ?C (98 ?F) (Tympanic)  ~ Resp 18  ~ SpO2 97%     General: nad, pleasant  HEENT: anicteric, eomi, yellowish plaque on tongue that does not scrape off, mildly red tonsils w/o exudate  Lungs: breathing comfortably on RA  Neuro: alert and fully oriented, moving all ext spontaneously  Psych: normal mood and affect        Assessment/Plan:     Encounter Diagnoses   Name Primary?   ? Oral thrush Yes   ? Sore throat        #sore throat  - I reviewed note by Dr. Berdie Ogren on 06/06/22, pt w oral pain/swelling for 4 months following oral sex w tongue yellowing, hiv, rpr, g/c all negative  - pt reporting persistent sore throat as well as yellowing of tongue. Helps to not eat but otherwise sxs persisting  - no fevers  - sxs started 2-3 days following oral sex w a girl  - yellowing of tongue did not scrape off and not c/w oral thrush  - will send in throat cultuer and treat w course of augmentin for possinle bacterial pharyngitis following oral sex. Will send in nystatin swish given possibility of oral thrush though low and given low SE profile of medication  - referral to ID in case sxs don't improve    The above plan of care, diagnosis, orders, and follow-up were discussed with the patient.  Questions related to this recommended plan of care were answered.    20 minutes was spent by me pre-charting, interviewing and examining patient, documenting, and placing orders.     Ayaan Shutes  Beroukhim Afrahimi, MD  06/08/2022 at 5:43 PM

## 2022-06-10 LAB — Bacterial Culture Throat

## 2022-06-11 LAB — Bacterial Culture Throat

## 2022-06-13 ENCOUNTER — Telehealth: Payer: BLUE CROSS/BLUE SHIELD

## 2022-06-13 NOTE — Telephone Encounter
New Rx Request      Last seen by MD:     Reason for the request: Patient stating that the medications the doctor prescribed nystatin 100000 is working but the fungus infection is still not completely gone .     Any Symptoms:  []  Yes  [x]  No       If yes, what symptoms are you experiencing:    o Duration of symptoms (how long):    o Have you taken medication for symptoms (OTC or Rx):      Is a particular medication being requested?    fluconazole  Was an appointment offered? yes    Patient or caller has been notified of the turnaround time of 1-2 business day(s).

## 2022-06-14 NOTE — Telephone Encounter
Called patient and LVM to call and schedule an appt for further evaluation.

## 2022-06-16 ENCOUNTER — Telehealth: Payer: BLUE CROSS/BLUE SHIELD

## 2022-06-16 NOTE — Telephone Encounter
Call Back Request      Reason for call back: Adding to last encounter. Per patient was told there was a possibility he had a stone and that might have caused the damage. He is still feeling pretty bad (he caught the flu) and was given antibiotics.     Please call patient back for the appointment accommodation.      Any Symptoms:  [x]  Yes  []  No       If yes, what symptoms are you experiencing:    o Duration of symptoms (how long):    o Have you taken medication for symptoms (OTC or Rx):      If call was taken outside of clinic hours:    [] Patient or caller has been notified that this message was sent outside of normal clinic hours.     [] Patient or caller has been warm transferred to the physician's answering service. If applicable, patient or caller informed to please call back if symptoms progress.  Patient or caller has been notified of the turnaround time of 1-2 business day(s).

## 2022-06-16 NOTE — Telephone Encounter
Appointment Accommodation Request      Appointment Type: Return or video visit    Reason for sooner request: Patient was at the hospital yesterday - was told he has kidney damage and would like to be seen soon.    Date/Time Requested (If any):     Last seen by MD: 09/30/2019    Any Symptoms:  [x]  Yes  []  No       If yes, what symptoms are you experiencing: Right and Left side hurt, but right kidney has possible damage. Patient says it hurts when he coughs. Pain is going away but would like some advise.   o Duration of symptoms (how long): a month    Patient or caller was offered an appointment but declined.    Patient or caller was advised to seek emergency services if conditions are urgent or emergent.    Patient or caller has been notified of the turnaround time of 1-2 business (days).

## 2022-06-17 ENCOUNTER — Ambulatory Visit: Payer: BLUE CROSS/BLUE SHIELD

## 2022-06-17 DIAGNOSIS — N200 Calculus of kidney: Secondary | ICD-10-CM

## 2022-06-17 DIAGNOSIS — N5082 Scrotal pain: Secondary | ICD-10-CM

## 2022-06-17 NOTE — Progress Notes
Patient to bring records in ***    Chief Complaint:  Ureteral stone ***     History of Present Illness:  Jonathan [facility]) is a 26 y.o. man seen for ureteral*** stone.     He also has scrotal pain. He complains of intermittent left testicular pain in February 2021, that started after a heavy workout.  This is associated with left hip, back, and lower abdominal discomfort. Pain is not improved in the supine position.  Denies hematuria, urethral discharge, or dysuria. The pain does not prevent him from activities of daily living. He has had no new sexual partners at the onset of symptoms. No improvement with doxycyline and azithromycin. Testicular pain improves with scrotal support.     Previous treatments:  Colonoscopy on 09/18/19 due to diarrhea     Interval History:  Last seen on  09/30/19. Since our last visit,  -Ureteral stone: ***    -Scrotal pain: This resolved*** with a course of meloxicam.      Patient Active Problem List   Diagnosis   (none) - all problems resolved or deleted      Past Surgical History:  None.    Current Outpatient Medications   Medication Sig   ? nystatin 100000 unit/mL suspension Take 5 mLs (500,000 Units total) by mouth four (4) times daily.     No current facility-administered medications for this visit.      Allergies   Allergen Reactions   ? Gluten Meal    ? Wheat      Social History     Social History Narrative   ? Not on file   [employer]      Family History:  No testicular cancer.  Grandfather had prostate cancer diagnosed at age 21 years.       Exam:  Wt Readings from Last 3 Encounters:   06/06/22 205 lb (93 kg)   12/29/21 207 lb 3.2 oz (94 kg)   08/04/20 (!) 224 lb (101.6 kg)     Temp Readings from Last 3 Encounters:   06/08/22 36.7 ?C (98 ?F) (Tympanic)   06/06/22 36.7 ?C (98 ?F)   12/02/20 37 ?C (98.6 ?F) (Forehead)     BP Readings from Last 3 Encounters:   06/08/22 133/85   06/06/22 131/73   12/29/21 117/76     Pulse Readings from Last 3 Encounters: 06/08/22 62   06/06/22 72   12/29/21 73     Constitutional:   -No acute distress, normal habitus, well groomed.   GU: ***  -Reducible left inguinal hernia, tenderness of the left inguinal canal.   -Possible grade 1 left varicocele.   -No testicular mass. Vertical lie of testes. No testicular atrophy.   -Tenderness of the left epididymis.        A trained chaperone was present for this exam/procedure: No***  Reason why a trained chaperone was not present: Patient declined        Labs:  Lab Results   Component Value Date    CREAT 1.05 12/29/2021    CREAT 1.09 12/02/2020    CREAT 1.05 10/21/2019    CREAT 1.06 05/22/2019    CREAT 1.07 10/18/2018    CREAT 1.1 02/11/2015     Lab Results   Component Value Date    HGBA1C 5.8 (H) 12/29/2021       Results for orders placed or performed in visit on 12/02/20   UA,Dipstick    Specimen: Clean Catch, Midstream; Urine   Result  Value Ref Range    Urine Color Light-Yellow      Specific Gravity 1.020 1.005 - 1.030    pH,Urine 6.5 5.0 - 8.0    Blood Negative Negative    Bilirubin Negative Negative    Ketones Negative Negative    Glucose Negative Negative    Protein Negative Negative    Leukocyte Esterase Negative Negative    Nitrite Negative Negative   UA,Microscopic    Specimen: Clean Catch, Midstream; Urine   Result Value Ref Range    RBC per uL 2 0 - 11 cells/uL    WBC per uL 5 0 - 22 cells/uL    RBC per HPF 0 0 - 2 cells/HPF    WBC per HPF 1 0 - 4 cells/HPF    Squamous Epi Cells 1 0 - 17 cells/uL   Results for orders placed or performed in visit on 10/18/18   Urinalysis,Routine    Specimen: Clean Catch, Midstream; Urine    Narrative    The following orders were created for panel order Urinalysis,Routine.  Procedure                               Abnormality         Status                     ---------                               -----------         ------                     UA,Dipstick[[id number]]                  Normal              Final result UA,Microscopic[[id number]]               Normal              Final result                 Please view results for these tests on the individual orders.         Results for orders placed or performed in visit on 02/11/15   Chlamydia trachomatis PCR, Urine    Specimen: Clean Catch, Midstream; Urine   Result Value Ref Range    Specimen Type Urine     Chlamydia trachomatis PCR Negative Negative     Results for orders placed or performed in visit on 06/06/22   Chlamydia trachomatis/Neisseria gonorrhoeae PCR, Urine    Specimen: Urine, voided   Result Value Ref Range    Chlamydia trachomatis PCR Negative Negative    Neisseria gonorrhoeae PCR Negative Negative    Specimen Type Urine       Imaging:  09/23/19 US Scrotum  On my review of his images, there is a possible left varicocele. No testicular mass. No testicular atrophy.     Procedure:   09/30/19 post void residual 20cc   06/17/22 post void residual ***cc       Assessment and Plan:   1. Left groin strain. I suspect his left scrotal and hip pain are secondary to groin strain. The pain symptoms began after a heavy workout.  No testicular mass on exam. This resolved*** with a course  of meloxicam.   2. Left inguinal hernia. He has a reducible left inguinal hernia and tenderness of the left inguinal canal.   3. Left epididymitis. Post void residual low. Symptoms improved but not resolved on doxycyline and azithromycin.   -Post void residual independently interpreted.   -Patient will obtain scrotal ultrasound read and email this to me. ***     -Conservative therapy including stretching, scrotal support, and warm compresses.   -Avoid heavy lifting, straining, or any activities that cause groin pain.    4. Urolithiasis.  -***For kidney stones <18mm, I recommend observation given the high rate of spontaneous passage.   -For kidney stones >7mm, there is a ~50% chance that the stone will become symptomatic within a 5 year period from ureteral migration. Risks of observation reviewed in detail, including stone growth with ureteral migration resulting in pain/colic, infection, and renal injury.  Risks, benefits, details of surgery and expected post-operative course, were reviewed for ESWL and renoureteroscopy with laser lithotripsy +/- JJ Stent. Patient chose ***. Refer to Dr. Marland Kitchen for ***. Paperwork was given to our OR scheduler. ***UCx collected today.     ***-The ureteral stone may have passed but stone passage can not be confirmed without stone collection. Renal ultrasound or KUB with urinalysis, or CT scan, can be used to assess if stone passed. CT scan is more definitive then the other studies, but there is a small risk of secondary malignancies from radiation. Patient elects to undergo ***CT KUB.   ***-For the obstructive ureteral stone, options reviewed including medical expulsive therapy, extracorporeal shock wave lithotripsy, and renoureteroscopy with laser lithotripsy +/- JJ Stent. Risks of medical expulsive therapy reviewed in detail, including possible need for additional treatments, pain/colic with unpredictable degree/time period, infection, and renal injury. Risks, benefits, details of surgery and expected post-operative course, were reviewed for ESWL and renoureteroscopy with laser lithotripsy +/- JJ Stent. Patient chose ***. Refer to Dr. Marland Kitchen for direct book ***.  OR scheduler notified. ***UCx collected today.     -Increase flomax to 0.8mg  qHS until stone passes or is removed. Also reviewed tamsulosin can reduce ureteral stent pain. Reviewed side effects of alpha blockers that include but are not limited to dizziness, syncope, orthostatic hypotension, ***retrograde ejaculation, and IFIS (intraoperative floppy iris syndrome).   -Stone is ***not a candidate for dissolution based on density on CT scan.  -Strain all urine  -Pain control with ***motrin 400mg  q8h PRN, tylenol 500mg  q6h PRN, and narcotics PRN severe pain. ***Given recent creatinine result, GFR drawn today. Patient asked not to take NSAIDs such as motrin or toradol and we contact them with the today's GFR result.   -Discussed stone prevention diet and handout provided.   -Precautions were provided to go to the Emergency Room if there is fever >101 or intolerable renal colic.  -***Discussed 24 hour urine collection metabolic workup for stone disease to assess the patient's stone parameters after passage/removal of obstructing stone. This will tell us if medication can be used to reduce/slow future stone formation vs dietary management. The patient*** agrees and will return to clinic in ***2 months to discuss the results of the collection.     Return to clinic in 2 weeks or earlier as needed.    ***  -Urocit K 10-25mEq TID x 3 months  -1/4 teaspoon twice a day in 8oz warm water Derl Barrow). Studies have shown that this increases urine citrate, increases urine pH, and not increase urine calcium  Results to be sent by [facility]  Return to clinic ***    SCRIBE SIGNATURE(S):  I, Devi Hopman, have assisted Dr. Chaney Malling with the documentation for [name].    Attending Signature:  This note was prepared by my scribe Askari Kinley.  I have seen and examined the patient and completed the plan of care. This note has been reviewed and edited by me to reflect my findings, assessment, and treatment plan as stated in this document.     Francoise Ceo, MD

## 2022-09-30 ENCOUNTER — Ambulatory Visit: Payer: BLUE CROSS/BLUE SHIELD | Attending: Student in an Organized Health Care Education/Training Program

## 2024-05-07 ENCOUNTER — Emergency Department
Admission: EM | Admit: 2024-05-07 | Discharge: 2024-05-07 | Discharge status: Against Medical Advice | Payer: Self-pay | Source: Home / Self Care
